# Patient Record
Sex: Female | Born: 1970 | Race: White | Hispanic: No | Marital: Single | Smoking: Never smoker
Health system: Southern US, Community
[De-identification: ages and names within clinical notes are randomized; demographics above are authoritative.]

## PROBLEM LIST (undated history)

## (undated) DIAGNOSIS — R51 Headache: Secondary | ICD-10-CM

## (undated) DIAGNOSIS — F419 Anxiety disorder, unspecified: Secondary | ICD-10-CM

## (undated) DIAGNOSIS — N200 Calculus of kidney: Secondary | ICD-10-CM

## (undated) DIAGNOSIS — Z87442 Personal history of urinary calculi: Secondary | ICD-10-CM

## (undated) DIAGNOSIS — F329 Major depressive disorder, single episode, unspecified: Secondary | ICD-10-CM

## (undated) DIAGNOSIS — N83209 Unspecified ovarian cyst, unspecified side: Secondary | ICD-10-CM

## (undated) DIAGNOSIS — T7840XA Allergy, unspecified, initial encounter: Secondary | ICD-10-CM

## (undated) DIAGNOSIS — R519 Headache, unspecified: Secondary | ICD-10-CM

## (undated) DIAGNOSIS — F32A Depression, unspecified: Secondary | ICD-10-CM

## (undated) HISTORY — PX: KIDNEY STONE SURGERY: SHX686

## (undated) HISTORY — DX: Calculus of kidney: N20.0

## (undated) HISTORY — DX: Major depressive disorder, single episode, unspecified: F32.9

## (undated) HISTORY — PX: FRACTURE SURGERY: SHX138

## (undated) HISTORY — PX: TONSILLECTOMY: SUR1361

## (undated) HISTORY — DX: Depression, unspecified: F32.A

## (undated) HISTORY — DX: Unspecified ovarian cyst, unspecified side: N83.209

## (undated) HISTORY — DX: Anxiety disorder, unspecified: F41.9

## (undated) HISTORY — DX: Headache: R51

## (undated) HISTORY — DX: Headache, unspecified: R51.9

## (undated) HISTORY — DX: Allergy, unspecified, initial encounter: T78.40XA

## (undated) HISTORY — PX: TUBAL LIGATION: SHX77

## (undated) HISTORY — PX: FEMUR FRACTURE SURGERY: SHX633

---
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
## 2001-05-16 ENCOUNTER — Emergency Department (HOSPITAL_COMMUNITY): Admission: EM | Admit: 2001-05-16 | Discharge: 2001-05-16 | Payer: Self-pay | Admitting: Emergency Medicine

## 2001-05-16 ENCOUNTER — Encounter: Payer: Self-pay | Admitting: Emergency Medicine

## 2004-09-29 ENCOUNTER — Inpatient Hospital Stay: Payer: Self-pay

## 2004-11-05 ENCOUNTER — Ambulatory Visit: Payer: Self-pay

## 2006-06-26 ENCOUNTER — Ambulatory Visit: Payer: Self-pay | Admitting: Unknown Physician Specialty

## 2006-06-29 ENCOUNTER — Ambulatory Visit: Payer: Self-pay | Admitting: Unknown Physician Specialty

## 2006-06-30 ENCOUNTER — Inpatient Hospital Stay: Payer: Self-pay | Admitting: Unknown Physician Specialty

## 2006-08-29 ENCOUNTER — Ambulatory Visit: Payer: Self-pay | Admitting: Unknown Physician Specialty

## 2007-02-22 DIAGNOSIS — A0472 Enterocolitis due to Clostridium difficile, not specified as recurrent: Secondary | ICD-10-CM

## 2007-02-22 HISTORY — DX: Enterocolitis due to Clostridium difficile, not specified as recurrent: A04.72

## 2010-04-19 ENCOUNTER — Ambulatory Visit: Payer: Self-pay | Admitting: Obstetrics and Gynecology

## 2012-10-03 ENCOUNTER — Ambulatory Visit: Payer: Self-pay | Admitting: Obstetrics and Gynecology

## 2013-03-21 ENCOUNTER — Ambulatory Visit: Payer: Self-pay | Admitting: Family Medicine

## 2013-04-09 ENCOUNTER — Ambulatory Visit: Payer: Self-pay | Admitting: Family Medicine

## 2013-07-15 ENCOUNTER — Ambulatory Visit: Payer: Self-pay | Admitting: Physician Assistant

## 2013-07-15 LAB — RAPID STREP-A WITH REFLX: Micro Text Report: POSITIVE

## 2013-07-23 DIAGNOSIS — M5126 Other intervertebral disc displacement, lumbar region: Secondary | ICD-10-CM | POA: Insufficient documentation

## 2013-07-23 DIAGNOSIS — M5136 Other intervertebral disc degeneration, lumbar region: Secondary | ICD-10-CM | POA: Insufficient documentation

## 2014-01-09 ENCOUNTER — Ambulatory Visit: Payer: Self-pay | Admitting: Obstetrics and Gynecology

## 2014-07-29 ENCOUNTER — Encounter: Payer: Self-pay | Admitting: Psychiatry

## 2014-07-29 ENCOUNTER — Ambulatory Visit (INDEPENDENT_AMBULATORY_CARE_PROVIDER_SITE_OTHER): Payer: 59 | Admitting: Psychiatry

## 2014-07-29 ENCOUNTER — Other Ambulatory Visit: Payer: Self-pay

## 2014-07-29 VITALS — BP 118/76 | HR 71 | Temp 98.5°F | Ht 68.0 in

## 2014-07-29 DIAGNOSIS — F331 Major depressive disorder, recurrent, moderate: Secondary | ICD-10-CM

## 2014-07-29 MED ORDER — ZOLPIDEM TARTRATE 5 MG PO TABS: 5.0000 mg | ORAL_TABLET | Freq: Every evening | ORAL | Status: DC | PRN

## 2014-07-29 MED ORDER — DULOXETINE HCL 30 MG PO CPEP: 30.0000 mg | ORAL_CAPSULE | Freq: Every day | ORAL | Status: DC

## 2014-07-29 NOTE — Progress Notes (Signed)
BH MD/PA/NP OP Progress Note  07/29/2014 4:36 PM Susan Garner  MRN:  [id number]  Subjective:  As documented in all scripts, patient indicated that she was having problems with Wellbutrin and that it was making her more depressed. In our phone conversation on 10/17/2014 I instructed her to discontinue the Wellbutrin 150 mg XL and that we could restart her Effexor until she was seen in the appointment today. However patient came she continue taking the Wellbutrin 150 mg XL. She states she continues to feel depressed and feels is probably worse on the Wellbutrin. She indicates she never picked up the Effexor to restart it. She states she has not improve with the well which can and also has been having diarrhea since starting the Wellbutrin. She states that the diarrhea does not occur every day but is occurred pretty frequently since starting Wellbutrin.  She states that she has had prior trials of Prozac and Lexapro as well as her Effexor. We discussed that the plan would be to start a medication such as vibrated. However patient brought up that she has not been on Cymbalta. Given that she had a good response for several years with Effexor we will initiate a trial of Cymbalta.   Patient indicated that she spoke with a coworker and in the conversation and they surmised that the patient is a negative view on things in general. The patient indicated her main concerns are a lack of desire to do anything or go anywhere. She also states a recent stressor is that her daughter has been diagnosed with ADHD. She continues to go to work but does state things are a struggle. Chief Complaint: depression Chief Complaint    Depression     Visit Diagnosis:     ICD-9-CM ICD-10-CM   1. Major depressive disorder, recurrent episode, moderate 296.32 F33.1 zolpidem (AMBIEN) 5 MG tablet     DULoxetine (CYMBALTA) 30 MG capsule    Past Medical History:  Past Medical History  Diagnosis Date  . Anxiety   .  Depression     Past Surgical History  Procedure Laterality Date  . Tubal ligation    . Tonsillectomy     Family History: History reviewed. No pertinent family history. Social History:  History   Social History  . Marital Status: Single    Spouse Name: N/A  . Number of Children: N/A  . Years of Education: N/A   Social History Main Topics  . Smoking status: Never Smoker   . Smokeless tobacco: Never Used  . Alcohol Use: No  . Drug Use: No  . Sexual Activity: No   Other Topics Concern  . None   Social History Narrative  . None   Additional History:   Assessment:   Musculoskeletal: Strength & Muscle Tone: within normal limits Gait & Station: normal Patient leans: N/A  Psychiatric Specialty Exam: HPI  Review of Systems  Psychiatric/Behavioral: Positive for depression. Negative for suicidal ideas, hallucinations, memory loss and substance abuse. The patient is nervous/anxious and has insomnia.     Blood pressure 118/76, pulse 71, temperature 98.5 F (36.9 C), temperature source Tympanic, height  (1.727 m), last menstrual period 07/16/2014, SpO2 93 %.There is no weight on file to calculate BMI.  General Appearance: Neat and Well Groomed  Eye Contact:  Good  Speech:  Clear and Coherent and Normal Rate  Volume:  Normal  Mood:  Depressed  Affect:  Constricted  Thought Process:  Linear and Logical  Orientation:  Negative  Thought Content:  Negative  Suicidal Thoughts:  No  Homicidal Thoughts:  No  Memory:  Immediate;   Good Recent;   Good Remote;   Good  Judgement:  Good  Insight:  Good  Psychomotor Activity:  Negative  Concentration:  Good  Recall:  Good  Fund of Knowledge: Good  Language: Good  Akathisia:  Negative  Handed:  Right unknown  AIMS (if indicated):  Not done  Assets:  Communication Skills Desire for Improvement  ADL's:  Intact  Cognition: WNL  Sleep:  Poor to discusses that anxiety keeps her up    Is the patient at risk to self?   No. Has the patient been a risk to self in the past 6 months?  No. Has the patient been a risk to self within the distant past?  No. Is the patient a risk to others?  No. Has the patient been a risk to others in the past 6 months?  No. Has the patient been a risk to others within the distant past?  No.  Current Medications: Current Outpatient Prescriptions  Medication Sig Dispense Refill  . buPROPion (WELLBUTRIN XL) 150 MG 24 hr tablet     . DULoxetine (CYMBALTA) 30 MG capsule Take 1 capsule (30 mg total) by mouth daily. 30 capsule 1  . nitrofurantoin, macrocrystal-monohydrate, (MACROBID) 100 MG capsule     . venlafaxine XR (EFFEXOR-XR) 75 MG 24 hr capsule     . zolpidem (AMBIEN) 5 MG tablet Take 1 tablet (5 mg total) by mouth at bedtime as needed for sleep. 30 tablet 1   No current facility-administered medications for this visit.    Medical Decision Making:  Established Problem, Stable/Improving (1) and Established Problem, Worsening (2) patient reports her depression has worsened.  Treatment Plan Summary:Medication management Patient has been instructed to discontinue the Wellbutrin XL. She will start Cymbalta 30 mg a day. We'll also start Ambien 5 mg at bedtime as needed for insomnia. Patient will follow-up in 4-5 weeks. She's been encouraged call with any questions or concerns prior to her next appointment.  Wallace Goinglton Danyael Alipio 07/29/2014, 4:36 PM

## 2014-07-30 ENCOUNTER — Ambulatory Visit: Payer: Self-pay | Admitting: Psychiatry

## 2014-09-03 ENCOUNTER — Ambulatory Visit: Payer: 59 | Admitting: Psychiatry

## 2014-10-10 ENCOUNTER — Ambulatory Visit: Payer: 59 | Admitting: Psychiatry

## 2014-11-04 ENCOUNTER — Telehealth: Payer: Self-pay | Admitting: Family Medicine

## 2014-11-04 NOTE — Telephone Encounter (Signed)
Patient saw you at [facility] and states that you had referred her for back pain. She had seen someone at [facility] but decided not to see them. Requesting that you send a referral to [facility]. (P) [phone] (F) [phone]--i did inform patient she may have to schedule an appointment, she understood, but requested that i send the message first to see what the doctor said.

## 2014-11-04 NOTE — Telephone Encounter (Signed)
Routed to Dr. Shah for referral 

## 2014-11-11 ENCOUNTER — Ambulatory Visit (INDEPENDENT_AMBULATORY_CARE_PROVIDER_SITE_OTHER): Payer: Managed Care, Other (non HMO) | Admitting: Family Medicine

## 2014-11-11 ENCOUNTER — Encounter: Payer: Self-pay | Admitting: Family Medicine

## 2014-11-11 VITALS — BP 110/76 | HR 105 | Temp 98.0°F | Resp 16 | Ht 68.0 in | Wt 156.4 lb

## 2014-11-11 DIAGNOSIS — J320 Chronic maxillary sinusitis: Secondary | ICD-10-CM | POA: Insufficient documentation

## 2014-11-11 DIAGNOSIS — J0110 Acute frontal sinusitis, unspecified: Secondary | ICD-10-CM | POA: Diagnosis not present

## 2014-11-11 DIAGNOSIS — G8929 Other chronic pain: Secondary | ICD-10-CM | POA: Diagnosis not present

## 2014-11-11 DIAGNOSIS — M5416 Radiculopathy, lumbar region: Principal | ICD-10-CM

## 2014-11-11 DIAGNOSIS — M5410 Radiculopathy, site unspecified: Secondary | ICD-10-CM | POA: Diagnosis not present

## 2014-11-11 MED ORDER — AZITHROMYCIN 250 MG PO TABS: 250.0000 mg | ORAL_TABLET | Freq: Every day | ORAL | Status: DC

## 2014-11-11 NOTE — Progress Notes (Signed)
Name: Susan Garner   MRN: [id number]    DOB: 09/12/1970   Date:11/11/2014       Progress Note  Subjective  Chief Complaint  Chief Complaint  Patient presents with  . Back Pain    Back Pain This is a chronic problem. The pain is present in the lumbar spine. The pain radiates to the left knee and right knee. The pain is at a severity of 5/10. The pain is moderate. Pertinent negatives include no bladder incontinence or bowel incontinence. She has tried analgesics for the symptoms.  Sinusitis This is a new problem. There has been no fever. Associated symptoms include sinus pressure. Pertinent negatives include no chills, congestion, coughing, ear pain, sore throat or swollen glands. Past treatments include nothing.    Past Medical History  Diagnosis Date  . Anxiety   . Depression     Past Surgical History  Procedure Laterality Date  . Tubal ligation    . Tonsillectomy      No family history on file.  Social History   Social History  . Marital Status: Single    Spouse Name: N/A  . Number of Children: N/A  . Years of Education: N/A   Occupational History  . Not on file.   Social History Main Topics  . Smoking status: Never Smoker   . Smokeless tobacco: Never Used  . Alcohol Use: No  . Drug Use: No  . Sexual Activity: No   Other Topics Concern  . Not on file   Social History Narrative    No current outpatient prescriptions on file.  No Known Allergies   Review of Systems  Constitutional: Negative for chills.  HENT: Positive for sinus pressure. Negative for congestion, ear pain and sore throat.   Respiratory: Negative for cough.   Gastrointestinal: Negative for bowel incontinence.  Genitourinary: Negative for bladder incontinence.  Musculoskeletal: Positive for back pain.   Objective  Filed Vitals:   11/11/14 1158  BP: 110/76  Pulse: 105  Temp: 98 F (36.7 C)  Resp: 16  Height:  (1.727 m)  Weight: 156 lb 7 oz (70.96 kg)  SpO2: 96%     Physical Exam  Constitutional: She is well-developed, well-nourished, and in no distress.  HENT:  Right Ear: Tympanic membrane normal.  Left Ear: Tympanic membrane normal.  Nose: Right sinus exhibits maxillary sinus tenderness and frontal sinus tenderness. Left sinus exhibits maxillary sinus tenderness and frontal sinus tenderness.  Mouth/Throat: Posterior oropharyngeal erythema present.  Cardiovascular: Normal rate and regular rhythm.   Pulmonary/Chest: Effort normal and breath sounds normal.  Musculoskeletal:       Lumbar back: She exhibits tenderness and pain. She exhibits no spasm.       Back:  Nursing note and vitals reviewed.   Assessment & Plan  1. Chronic radicular low back pain Recurrence of chronic low back pain. No red flags at present but patient has history of disc herniation. Patient wishes to be referred to [facility]. - Ambulatory referral to Neurosurgery  2. Acute frontal sinusitis, recurrence not specified  - azithromycin (ZITHROMAX Z-PAK) 250 MG tablet; Take 1 tablet (250 mg total) by mouth daily. 2 tabs po x day 1, then 1 tab po q day x 4 days.  Dispense: 6 each; Refill: 0   Syed Asad [facility] 11/11/2014 12:44 PM

## 2014-11-16 NOTE — Telephone Encounter (Signed)
Patient was seen for office visit on 11/11/2014 and referral to neurosurgery provided

## 2014-11-19 NOTE — Telephone Encounter (Signed)
Per Dr. Sherryll Burger Patient was seen for office visit on 11/11/2014 and referral to neurosurgery provided

## 2014-11-25 ENCOUNTER — Encounter: Payer: Self-pay | Admitting: Family Medicine

## 2014-11-25 ENCOUNTER — Ambulatory Visit (INDEPENDENT_AMBULATORY_CARE_PROVIDER_SITE_OTHER): Payer: Managed Care, Other (non HMO) | Admitting: Family Medicine

## 2014-11-25 VITALS — BP 112/70 | HR 92 | Temp 98.8°F | Resp 18 | Ht 68.0 in | Wt 157.8 lb

## 2014-11-25 DIAGNOSIS — F32A Depression, unspecified: Secondary | ICD-10-CM | POA: Insufficient documentation

## 2014-11-25 DIAGNOSIS — H9201 Otalgia, right ear: Secondary | ICD-10-CM | POA: Insufficient documentation

## 2014-11-25 DIAGNOSIS — R5382 Chronic fatigue, unspecified: Secondary | ICD-10-CM

## 2014-11-25 DIAGNOSIS — F329 Major depressive disorder, single episode, unspecified: Secondary | ICD-10-CM | POA: Diagnosis not present

## 2014-11-25 DIAGNOSIS — F419 Anxiety disorder, unspecified: Secondary | ICD-10-CM | POA: Diagnosis not present

## 2014-11-25 DIAGNOSIS — R5383 Other fatigue: Secondary | ICD-10-CM | POA: Insufficient documentation

## 2014-11-25 MED ORDER — ALPRAZOLAM 0.25 MG PO TABS: 0.2500 mg | ORAL_TABLET | Freq: Two times a day (BID) | ORAL | Status: DC | PRN

## 2014-11-25 MED ORDER — CITALOPRAM HYDROBROMIDE 20 MG PO TABS: 20.0000 mg | ORAL_TABLET | Freq: Every day | ORAL | Status: DC

## 2014-11-25 MED ORDER — AMOXICILLIN-POT CLAVULANATE 875-125 MG PO TABS: 1.0000 | ORAL_TABLET | Freq: Two times a day (BID) | ORAL | Status: DC

## 2014-11-25 NOTE — Progress Notes (Signed)
Name: Susan Garner   MRN: [id number]    DOB: 01/17/1971   Date:11/25/2014       Progress Note  Subjective  Chief Complaint  Chief Complaint  Patient presents with  . Follow-up    2 wk  . Anxiety   Anxiety Presents for follow-up visit. Symptoms include decreased concentration, depressed mood, excessive worry, insomnia, irritability, malaise, nervous/anxious behavior and restlessness. Patient reports no panic, shortness of breath or suicidal ideas.   Her past medical history is significant for depression.  Depression      The patient presents with depression.  This is a chronic problem.  The problem occurs constantly.  Associated symptoms include decreased concentration, fatigue, insomnia, irritable, restlessness and headaches.  Associated symptoms include no suicidal ideas.  Past treatments include SSRIs - Selective serotonin reuptake inhibitors and SNRIs - Serotonin and norepinephrine reuptake inhibitors (She has tried Prozac, Lexapro, Cymbalta, and Wellbutrin in the past).  Past medical history includes anxiety and depression.   Ear Fullness  There is pain in the right ear. This is a recurrent problem. The current episode started more than 1 month ago. There has been no fever. Associated symptoms include headaches. Pertinent negatives include no ear discharge or hearing loss.   Past Medical History  Diagnosis Date  . Anxiety   . Depression     Past Surgical History  Procedure Laterality Date  . Tubal ligation    . Tonsillectomy      History reviewed. No pertinent family history.  Social History   Social History  . Marital Status: Single    Spouse Name: N/A  . Number of Children: N/A  . Years of Education: N/A   Occupational History  . Not on file.   Social History Main Topics  . Smoking status: Never Smoker   . Smokeless tobacco: Never Used  . Alcohol Use: No  . Drug Use: No  . Sexual Activity: No   Other Topics Concern  . Not on file   Social History  Narrative    No current outpatient prescriptions on file.  No Known Allergies  Review of Systems  Constitutional: Positive for irritability and fatigue. Negative for fever and chills.  HENT: Positive for ear pain. Negative for ear discharge, hearing loss and tinnitus.   Respiratory: Negative for shortness of breath.   Neurological: Positive for headaches.  Psychiatric/Behavioral: Positive for depression and decreased concentration. Negative for suicidal ideas and substance abuse. The patient is nervous/anxious and has insomnia.    Objective  Filed Vitals:   11/25/14 1409  BP: 112/70  Pulse: 92  Temp: 98.8 F (37.1 C)  TempSrc: Oral  Resp: 18  Height:  (1.727 m)  Weight: 157 lb 12.8 oz (71.578 kg)  SpO2: 98%   Physical Exam  Constitutional: She is oriented to person, place, and time and well-developed, well-nourished, and in no distress. She is irritable.  HENT:  Right Ear: Hearing, tympanic membrane and ear canal normal.  Nose: Right sinus exhibits frontal sinus tenderness. Right sinus exhibits no maxillary sinus tenderness. Left sinus exhibits no frontal sinus tenderness.  Mouth/Throat: No posterior oropharyngeal erythema.  Cardiovascular: Normal rate and regular rhythm.   Pulmonary/Chest: Effort normal and breath sounds normal.  Abdominal: Soft. Bowel sounds are normal.  Neurological: She is alert and oriented to person, place, and time.  Psychiatric: Memory, affect and judgment normal.  Nursing note and vitals reviewed.   Assessment & Plan  1. Anxiety Started patient on Alprazolam 0.25 mg twice a  day as needed for recurrent symptoms of anxiety. Follow-up in one month.  - ALPRAZolam (XANAX) 0.25 MG tablet; Take 1 tablet (0.25 mg total) by mouth 2 (two) times daily as needed for anxiety.  Dispense: 60 tablet; Refill: 0  2. Depression Started patient on citalopram 20 mg daily for symptoms of depression unresponsive to multiple medications in the past. Educated  on potential adverse reactions. Follow-up in one month. - citalopram (CELEXA) 20 MG tablet; Take 1 tablet (20 mg total) by mouth daily.  Dispense: 30 tablet; Refill: 0  3. Chronic fatigue  - CBC with Differential - Comprehensive Metabolic Panel (CMET) - Vitamin D (25 hydroxy) - TSH - HgB A1c  4. Right ear pain Symptoms consistent with  Sinusitis. We will prescribe Augmentin for 10 days and encouraged patient to follow up with ENT. She verbalized understanding. - amoxicillin-clavulanate (AUGMENTIN) 875-125 MG tablet; Take 1 tablet by mouth 2 (two) times daily.  Dispense: 20 tablet; Refill: 0  Jazara Swiney [facility]ad [facility] 11/25/2014 2:21 PM

## 2014-11-26 LAB — COMPREHENSIVE METABOLIC PANEL
ALBUMIN: 4.6 g/dL (ref 3.5–5.5)
ALK PHOS: 59 IU/L (ref 39–117)
ALT: 7 IU/L (ref 0–32)
AST: 12 IU/L (ref 0–40)
Albumin/Globulin Ratio: 1.8 (ref 1.1–2.5)
BUN / CREAT RATIO: 15 (ref 9–23)
BUN: 11 mg/dL (ref 6–24)
Bilirubin Total: 0.5 mg/dL (ref 0.0–1.2)
CO2: 24 mmol/L (ref 18–29)
CREATININE: 0.74 mg/dL (ref 0.57–1.00)
Calcium: 9.7 mg/dL (ref 8.7–10.2)
Chloride: 101 mmol/L (ref 97–108)
GFR calc non Af Amer: 100 mL/min/{1.73_m2} (ref >59)
GFR, EST AFRICAN AMERICAN: 115 mL/min/{1.73_m2} (ref >59)
GLOBULIN, TOTAL: 2.6 g/dL (ref 1.5–4.5)
Glucose: 87 mg/dL (ref 65–99)
Potassium: 4.6 mmol/L (ref 3.5–5.2)
SODIUM: 139 mmol/L (ref 134–144)
TOTAL PROTEIN: 7.2 g/dL (ref 6.0–8.5)

## 2014-11-26 LAB — CBC WITH DIFFERENTIAL/PLATELET
Basophils Absolute: 0 10*3/uL (ref 0.0–0.2)
Basos: 1 %
EOS (ABSOLUTE): 0.1 10*3/uL (ref 0.0–0.4)
EOS: 2 %
HEMATOCRIT: 37 % (ref 34.0–46.6)
HEMOGLOBIN: 12.2 g/dL (ref 11.1–15.9)
Immature Grans (Abs): 0 10*3/uL (ref 0.0–0.1)
Immature Granulocytes: 0 %
LYMPHS ABS: 2.1 10*3/uL (ref 0.7–3.1)
Lymphs: 26 %
MCH: 29 pg (ref 26.6–33.0)
MCHC: 33 g/dL (ref 31.5–35.7)
MCV: 88 fL (ref 79–97)
MONOCYTES: 7 %
Monocytes Absolute: 0.6 10*3/uL (ref 0.1–0.9)
NEUTROS ABS: 5.2 10*3/uL (ref 1.4–7.0)
Neutrophils: 64 %
Platelets: 326 10*3/uL (ref 150–379)
RBC: 4.2 x10E6/uL (ref 3.77–5.28)
RDW: 12.6 % (ref 12.3–15.4)
WBC: 8.1 10*3/uL (ref 3.4–10.8)

## 2014-11-26 LAB — HEMOGLOBIN A1C
Est. average glucose Bld gHb Est-mCnc: 105 mg/dL
Hgb A1c MFr Bld: 5.3 % (ref 4.8–5.6)

## 2014-11-26 LAB — VITAMIN D 25 HYDROXY (VIT D DEFICIENCY, FRACTURES): VIT D 25 HYDROXY: 29.1 ng/mL — AB (ref 30.0–100.0)

## 2014-11-26 LAB — TSH: TSH: 1.37 u[IU]/mL (ref 0.450–4.500)

## 2014-12-10 ENCOUNTER — Other Ambulatory Visit: Payer: Self-pay | Admitting: Neurosurgery

## 2014-12-10 DIAGNOSIS — M5126 Other intervertebral disc displacement, lumbar region: Secondary | ICD-10-CM

## 2014-12-23 ENCOUNTER — Telehealth: Payer: Self-pay | Admitting: Family Medicine

## 2014-12-23 DIAGNOSIS — F329 Major depressive disorder, single episode, unspecified: Secondary | ICD-10-CM

## 2014-12-23 DIAGNOSIS — F32A Depression, unspecified: Secondary | ICD-10-CM

## 2014-12-23 NOTE — Telephone Encounter (Signed)
Patient is requesting a refill of citalopram.

## 2014-12-24 ENCOUNTER — Telehealth: Payer: Self-pay | Admitting: Emergency Medicine

## 2014-12-24 MED ORDER — CITALOPRAM HYDROBROMIDE 20 MG PO TABS: 20.0000 mg | ORAL_TABLET | Freq: Every day | ORAL | Status: DC

## 2014-12-24 NOTE — Telephone Encounter (Signed)
Script sent to pharmacy.

## 2014-12-26 ENCOUNTER — Ambulatory Visit
Admission: RE | Admit: 2014-12-26 | Discharge: 2014-12-26 | Disposition: A | Discharge status: Home / Self Care | Payer: Managed Care, Other (non HMO) | Source: Ambulatory Visit | Attending: Neurosurgery | Admitting: Neurosurgery

## 2014-12-26 DIAGNOSIS — M5126 Other intervertebral disc displacement, lumbar region: Secondary | ICD-10-CM | POA: Diagnosis present

## 2014-12-26 DIAGNOSIS — M5127 Other intervertebral disc displacement, lumbosacral region: Secondary | ICD-10-CM | POA: Insufficient documentation

## 2015-01-06 ENCOUNTER — Other Ambulatory Visit: Payer: Self-pay | Admitting: Obstetrics and Gynecology

## 2015-01-06 DIAGNOSIS — Z1231 Encounter for screening mammogram for malignant neoplasm of breast: Secondary | ICD-10-CM

## 2015-01-23 ENCOUNTER — Other Ambulatory Visit: Payer: Self-pay | Admitting: Family Medicine

## 2015-02-19 ENCOUNTER — Ambulatory Visit: Payer: Managed Care, Other (non HMO) | Attending: Anesthesiology | Admitting: Anesthesiology

## 2015-02-19 ENCOUNTER — Encounter: Payer: Self-pay | Admitting: Anesthesiology

## 2015-02-19 VITALS — BP 115/64 | HR 62 | Temp 98.1°F | Resp 18 | Ht 68.0 in | Wt 158.0 lb

## 2015-02-19 DIAGNOSIS — M2170 Unequal limb length (acquired), unspecified site: Secondary | ICD-10-CM | POA: Diagnosis not present

## 2015-02-19 DIAGNOSIS — M5136 Other intervertebral disc degeneration, lumbar region: Secondary | ICD-10-CM | POA: Diagnosis not present

## 2015-02-19 DIAGNOSIS — M5116 Intervertebral disc disorders with radiculopathy, lumbar region: Secondary | ICD-10-CM | POA: Diagnosis not present

## 2015-02-19 DIAGNOSIS — F329 Major depressive disorder, single episode, unspecified: Secondary | ICD-10-CM | POA: Diagnosis not present

## 2015-02-19 DIAGNOSIS — M461 Sacroiliitis, not elsewhere classified: Secondary | ICD-10-CM | POA: Insufficient documentation

## 2015-02-19 DIAGNOSIS — M5441 Lumbago with sciatica, right side: Secondary | ICD-10-CM

## 2015-02-19 DIAGNOSIS — K6289 Other specified diseases of anus and rectum: Secondary | ICD-10-CM | POA: Diagnosis present

## 2015-02-19 DIAGNOSIS — M47817 Spondylosis without myelopathy or radiculopathy, lumbosacral region: Secondary | ICD-10-CM | POA: Diagnosis not present

## 2015-02-19 DIAGNOSIS — M5431 Sciatica, right side: Secondary | ICD-10-CM | POA: Diagnosis not present

## 2015-02-19 DIAGNOSIS — M533 Sacrococcygeal disorders, not elsewhere classified: Secondary | ICD-10-CM

## 2015-02-19 DIAGNOSIS — M549 Dorsalgia, unspecified: Secondary | ICD-10-CM | POA: Diagnosis present

## 2015-02-19 DIAGNOSIS — M545 Low back pain: Secondary | ICD-10-CM | POA: Insufficient documentation

## 2015-02-19 DIAGNOSIS — F419 Anxiety disorder, unspecified: Secondary | ICD-10-CM | POA: Diagnosis not present

## 2015-02-19 NOTE — Patient Instructions (Signed)
GENERAL RISKS AND COMPLICATIONS  What are the risk, side effects and possible complications? Generally speaking, most procedures are safe.  However, with any procedure there are risks, side effects, and the possibility of complications.  The risks and complications are dependent upon the sites that are lesioned, or the type of nerve block to be performed.  The closer the procedure is to the spine, the more serious the risks are.  Great care is taken when placing the radio frequency needles, block needles or lesioning probes, but sometimes complications can occur. 1. Infection: Any time there is an injection through the skin, there is a risk of infection.  This is why sterile conditions are used for these blocks.  There are four possible types of infection. 1. Localized skin infection. 2. Central Nervous System Infection-This can be in the form of Meningitis, which can be deadly. 3. Epidural Infections-This can be in the form of an epidural abscess, which can cause pressure inside of the spine, causing compression of the spinal cord with subsequent paralysis. This would require an emergency surgery to decompress, and there are no guarantees that the patient would recover from the paralysis. 4. Discitis-This is an infection of the intervertebral discs.  It occurs in about 1% of discography procedures.  It is difficult to treat and it may lead to surgery.        2. Pain: the needles have to go through skin and soft tissues, will cause soreness.       3. Damage to internal structures:  The nerves to be lesioned may be near blood vessels or    other nerves which can be potentially damaged.       4. Bleeding: Bleeding is more common if the patient is taking blood thinners such as  aspirin, Coumadin, Ticiid, Plavix, etc., or if he/she have some genetic predisposition  such as hemophilia. Bleeding into the spinal canal can cause compression of the spinal  cord with subsequent paralysis.  This would require an  emergency surgery to  decompress and there are no guarantees that the patient would recover from the  paralysis.       5. Pneumothorax:  Puncturing of a lung is a possibility, every time a needle is introduced in  the area of the chest or upper back.  Pneumothorax refers to free air around the  collapsed lung(s), inside of the thoracic cavity (chest cavity).  Another two possible  complications related to a similar event would include: Hemothorax and Chylothorax.   These are variations of the Pneumothorax, where instead of air around the collapsed  lung(s), you may have blood or chyle, respectively.       6. Spinal headaches: They may occur with any procedures in the area of the spine.       7. Persistent CSF (Cerebro-Spinal Fluid) leakage: This is a rare problem, but may occur  with prolonged intrathecal or epidural catheters either due to the formation of a fistulous  track or a dural tear.       8. Nerve damage: By working so close to the spinal cord, there is always a possibility of  nerve damage, which could be as serious as a permanent spinal cord injury with  paralysis.       9. Death:  Although rare, severe deadly allergic reactions known as "Anaphylactic  reaction" can occur to any of the medications used.      10. Worsening of the symptoms:  We can always make thing worse.    What are the chances of something like this happening? Chances of any of this occuring are extremely low.  By statistics, you have more of a chance of getting killed in a motor vehicle accident: while driving to the hospital than any of the above occurring .  Nevertheless, you should be aware that they are possibilities.  In general, it is similar to taking a shower.  Everybody knows that you can slip, hit your head and get killed.  Does that mean that you should not shower again?  Nevertheless always keep in mind that statistics do not mean anything if you happen to be on the wrong side of them.  Even if a procedure has a 1  (one) in a 1,000,000 (million) chance of going wrong, it you happen to be that one..Also, keep in mind that by statistics, you have more of a chance of having something go wrong when taking medications.  Who should not have this procedure? If you are on a blood thinning medication (e.g. Coumadin, Plavix, see list of "Blood Thinners"), or if you have an active infection going on, you should not have the procedure.  If you are taking any blood thinners, please inform your physician.  How should I prepare for this procedure?  Do not eat or drink anything at least six hours prior to the procedure.  Bring a driver with you .  It cannot be a taxi.  Come accompanied by an adult that can drive you back, and that is strong enough to help you if your legs get weak or numb from the local anesthetic.  Take all of your medicines the morning of the procedure with just enough water to swallow them.  If you have diabetes, make sure that you are scheduled to have your procedure done first thing in the morning, whenever possible.  If you have diabetes, take only half of your insulin dose and notify our nurse that you have done so as soon as you arrive at the clinic.  If you are diabetic, but only take blood sugar pills (oral hypoglycemic), then do not take them on the morning of your procedure.  You may take them after you have had the procedure.  Do not take aspirin or any aspirin-containing medications, at least eleven (11) days prior to the procedure.  They may prolong bleeding.  Wear loose fitting clothing that may be easy to take off and that you would not mind if it got stained with Betadine or blood.  Do not wear any jewelry or perfume  Remove any nail coloring.  It will interfere with some of our monitoring equipment.  NOTE: Remember that this is not meant to be interpreted as a complete list of all possible complications.  Unforeseen problems may occur.  BLOOD THINNERS The following drugs  contain aspirin or other products, which can cause increased bleeding during surgery and should not be taken for 2 weeks prior to and 1 week after surgery.  If you should need take something for relief of minor pain, you may take acetaminophen which is found in Tylenol,m Datril, Anacin-3 and Panadol. It is not blood thinner. The products listed below are.  Do not take any of the products listed below in addition to any listed on your instruction sheet.  A.P.C or A.P.C with Codeine Codeine Phosphate Capsules #3 Ibuprofen Ridaura  ABC compound Congesprin Imuran rimadil  Advil Cope Indocin Robaxisal  Alka-Seltzer Effervescent Pain Reliever and Antacid Coricidin or Coricidin-D  Indomethacin Rufen    Alka-Seltzer plus Cold Medicine Cosprin Ketoprofen S-A-C Tablets  Anacin Analgesic Tablets or Capsules Coumadin Korlgesic Salflex  Anacin Extra Strength Analgesic tablets or capsules CP-2 Tablets Lanoril Salicylate  Anaprox Cuprimine Capsules Levenox Salocol  Anexsia-D Dalteparin Magan Salsalate  Anodynos Darvon compound Magnesium Salicylate Sine-off  Ansaid Dasin Capsules Magsal Sodium Salicylate  Anturane Depen Capsules Marnal Soma  APF Arthritis pain formula Dewitt's Pills Measurin Stanback  Argesic Dia-Gesic Meclofenamic Sulfinpyrazone  Arthritis Bayer Timed Release Aspirin Diclofenac Meclomen Sulindac  Arthritis pain formula Anacin Dicumarol Medipren Supac  Analgesic (Safety coated) Arthralgen Diffunasal Mefanamic Suprofen  Arthritis Strength Bufferin Dihydrocodeine Mepro Compound Suprol  Arthropan liquid Dopirydamole Methcarbomol with Aspirin Synalgos  ASA tablets/Enseals Disalcid Micrainin Tagament  Ascriptin Doan's Midol Talwin  Ascriptin A/D Dolene Mobidin Tanderil  Ascriptin Extra Strength Dolobid Moblgesic Ticlid  Ascriptin with Codeine Doloprin or Doloprin with Codeine Momentum Tolectin  Asperbuf Duoprin Mono-gesic Trendar  Aspergum Duradyne Motrin or Motrin IB Triminicin  Aspirin  plain, buffered or enteric coated Durasal Myochrisine Trigesic  Aspirin Suppositories Easprin Nalfon Trillsate  Aspirin with Codeine Ecotrin Regular or Extra Strength Naprosyn Uracel  Atromid-S Efficin Naproxen Ursinus  Auranofin Capsules Elmiron Neocylate Vanquish  Axotal Emagrin Norgesic Verin  Azathioprine Empirin or Empirin with Codeine Normiflo Vitamin E  Azolid Emprazil Nuprin Voltaren  Bayer Aspirin plain, buffered or children's or timed BC Tablets or powders Encaprin Orgaran Warfarin Sodium  Buff-a-Comp Enoxaparin Orudis Zorpin  Buff-a-Comp with Codeine Equegesic Os-Cal-Gesic   Buffaprin Excedrin plain, buffered or Extra Strength Oxalid   Bufferin Arthritis Strength Feldene Oxphenbutazone   Bufferin plain or Extra Strength Feldene Capsules Oxycodone with Aspirin   Bufferin with Codeine Fenoprofen Fenoprofen Pabalate or Pabalate-SF   Buffets II Flogesic Panagesic   Buffinol plain or Extra Strength Florinal or Florinal with Codeine Panwarfarin   Buf-Tabs Flurbiprofen Penicillamine   Butalbital Compound Four-way cold tablets Penicillin   Butazolidin Fragmin Pepto-Bismol   Carbenicillin Geminisyn Percodan   Carna Arthritis Reliever Geopen Persantine   Carprofen Gold's salt Persistin   Chloramphenicol Goody's Phenylbutazone   Chloromycetin Haltrain Piroxlcam   Clmetidine heparin Plaquenil   Cllnoril Hyco-pap Ponstel   Clofibrate Hydroxy chloroquine Propoxyphen         Before stopping any of these medications, be sure to consult the physician who ordered them.  Some, such as Coumadin (Warfarin) are ordered to prevent or treat serious conditions such as "deep thrombosis", "pumonary embolisms", and other heart problems.  The amount of time that you may need off of the medication may also vary with the medication and the reason for which you were taking it.  If you are taking any of these medications, please make sure you notify your pain physician before you undergo any  procedures.         Epidural Steroid Injection Patient Information  Description: The epidural space surrounds the nerves as they exit the spinal cord.  In some patients, the nerves can be compressed and inflamed by a bulging disc or a tight spinal canal (spinal stenosis).  By injecting steroids into the epidural space, we can bring irritated nerves into direct contact with a potentially helpful medication.  These steroids act directly on the irritated nerves and can reduce swelling and inflammation which often leads to decreased pain.  Epidural steroids may be injected anywhere along the spine and from the neck to the low back depending upon the location of your pain.   After numbing the skin with local anesthetic (like Novocaine), a small needle is passed   into the epidural space slowly.  You may experience a sensation of pressure while this is being done.  The entire block usually last less than 10 minutes.  Conditions which may be treated by epidural steroids:   Low back and leg pain  Neck and arm pain  Spinal stenosis  Post-laminectomy syndrome  Herpes zoster (shingles) pain  Pain from compression fractures  Preparation for the injection:  1. Do not eat any solid food or dairy products within 6 hours of your appointment.  2. You may drink clear liquids up to 2 hours before appointment.  Clear liquids include water, black coffee, juice or soda.  No milk or cream please. 3. You may take your regular medication, including pain medications, with a sip of water before your appointment  Diabetics should hold regular insulin (if taken separately) and take 1/2 normal NPH dos the morning of the procedure.  Carry some sugar containing items with you to your appointment. 4. A driver must accompany you and be prepared to drive you home after your procedure.  5. Bring all your current medications with your. 6. An IV may be inserted and sedation may be given at the discretion of the  physician.   7. A blood pressure cuff, EKG and other monitors will often be applied during the procedure.  Some patients may need to have extra oxygen administered for a short period. 8. You will be asked to provide medical information, including your allergies, prior to the procedure.  We must know immediately if you are taking blood thinners (like Coumadin/Warfarin)  Or if you are allergic to IV iodine contrast (dye). We must know if you could possible be pregnant.  Possible side-effects:  Bleeding from needle site  Infection (rare, may require surgery)  Nerve injury (rare)  Numbness & tingling (temporary)  Difficulty urinating (rare, temporary)  Spinal headache ( a headache worse with upright posture)  Light -headedness (temporary)  Pain at injection site (several days)  Decreased blood pressure (temporary)  Weakness in arm/leg (temporary)  Pressure sensation in back/neck (temporary)  Call if you experience:  Fever/chills associated with headache or increased back/neck pain.  Headache worsened by an upright position.  New onset weakness or numbness of an extremity below the injection site  Hives or difficulty breathing (go to the emergency room)  Inflammation or drainage at the infection site  Severe back/neck pain  Any new symptoms which are concerning to you  Please note:  Although the local anesthetic injected can often make your back or neck feel good for several hours after the injection, the pain will likely return.  It takes 3-7 days for steroids to work in the epidural space.  You may not notice any pain relief for at least that one week.  If effective, we will often do a series of three injections spaced 3-6 weeks apart to maximally decrease your pain.  After the initial series, we generally will wait several months before considering a repeat injection of the same type.  If you have any questions, please call [phone] [facility] 

## 2015-02-19 NOTE — Progress Notes (Signed)
Safety precautions to be maintained throughout the outpatient stay will include: orient to surroundings, keep bed in low position, maintain call bell within reach at all times, provide assistance with transfer out of bed and ambulation.  

## 2015-02-20 NOTE — Progress Notes (Signed)
Subjective:  Patient ID: Susan Garner, female    DOB: 12/31/1970  Age: 44 y.o. MRN: [id number]  CC: Back Pain; Rectal Pain; and Leg Pain   HPI Caly Pellum presents for new patient evaluation.she was referred by Dr. Delma Officer for evaluation. She has a long-standing history of low back pain it's been present over 12 years. Primarily located in her right lower back with radiation in the right posterior and lateral and medial leg. She gets some radiation into the right calf as well. This appears to be quite troublesome especially in the morning when she lets her leg trying to put on her pants. Pain is gradually gotten worse over the last several months to a point where she sought attention with Dr. Lovell Sheehan and was referred here for evaluation and management. Her VAS score is a 10 generally and 9 or 10. Seems to be worse in the morning and afternoon. Aggravating factors include bending and kneeling and lifting squatting and stooping as well as walking and she has trouble getting relief from much of anything. The pain is described as depressing and wakes her up at night but she denies problems with bowel or bladder function and occasionally has some give way weakness but no other weakness noted. She has had a previous MRI scan and this showed evidence of degenerative disc disease most notably at L4-5 and L5-S1.  History Kathlyne has a past medical history of Anxiety and Depression.   She has past surgical history that includes Tubal ligation; Tonsillectomy; and Femur fracture surgery (Right, 1980's).   Her family history is not on file.She reports that she has never smoked. She has never used smokeless tobacco. She reports that she does not drink alcohol or use illicit drugs.   ---------------------------------------------------------------------------------------------------------------------- Past Medical History  Diagnosis Date  . Anxiety   . Depression     Past Surgical  History  Procedure Laterality Date  . Tubal ligation    . Tonsillectomy    . Femur fracture surgery Right 1980's    No family history on file.  Social History  Substance Use Topics  . Smoking status: Never Smoker   . Smokeless tobacco: Never Used  . Alcohol Use: No    ---------------------------------------------------------------------------------------------------------------------- Social History   Social History  . Marital Status: Single    Spouse Name: N/A  . Number of Children: N/A  . Years of Education: N/A   Social History Main Topics  . Smoking status: Never Smoker   . Smokeless tobacco: Never Used  . Alcohol Use: No  . Drug Use: No  . Sexual Activity: No   Other Topics Concern  . None   Social History Narrative    ----------------------------------------------------------------------------------------------------------------------  ROS Review of Systems   Objective:  BP 115/64 mmHg  Pulse 62  Temp(Src) 98.1 F (36.7 C) (Oral)  Resp 18  Ht  (1.727 m)  Wt 158 lb (71.668 kg)  BMI 24.03 kg/m2  SpO2 100%  LMP 02/12/2015 Physical Exam  Pupils are equally round reactive to light extraocular muscles are intact  Heart is regular rate and rhythm lungs are clear to auscultation  Inspection low back reveals some paraspinous muscle tenderness but no overt trigger points. She does have particular tenderness over the right SI joint. She does have pain on extension with left greater than right lateral rotation. When she is in the supine position she has an equivocal straight leg raise on the right and negative on the left. Muscle tone and  bulk is good strength appears to be 5 over 5 both proximal and distal and sensation is intact.     Assessment & Plan:   [name] was seen today for back pain, rectal pain and leg pain.  Diagnoses and all orders for this visit:  DDD (degenerative disc disease), lumbar  Facet arthritis of lumbosacral  region  Midline low back pain with right-sided sciatica    ----------------------------------------------------------------------------------------------------------------------  Problem List Items Addressed This Visit    None    Visit Diagnoses    DDD (degenerative disc disease), lumbar    -  Primary    Facet arthritis of lumbosacral region        Midline low back pain with right-sided sciatica           ----------------------------------------------------------------------------------------------------------------------  1. DDD (degenerative disc disease), lumbar Right side with radiculitis in the L5-S1 distribution  2. Facet arthritis of lumbosacral region Right side  3. Midline low back pain with right-sided sciatica Right greater than left  4. Sacroiliitis with history of leg length discrepancy.    ----------------------------------------------------------------------------------------------------------------------  I am having Ms. Odea maintain her ALPRAZolam, amoxicillin-clavulanate, and citalopram.   No orders of the defined types were placed in this encounter.      Follow-up: Return in about 2 weeks (around 03/05/2015) for procedure. we'll plan on proceeding with an epidural steroid injection as described to her today in full detail. Hopefully this will get her pain under better control. We've gone over the risks and benefits of the procedure and all questions are answered. No guarantees were made. Also want her to continue with physical therapy exercises as discussed with her today. Ultimately we may also consider a facet block or some of the facet genic characteristics to her pain. Yevette EdwardsJames G Denessa Cavan, MD  2

## 2015-03-07 ENCOUNTER — Other Ambulatory Visit: Payer: Self-pay | Admitting: Family Medicine

## 2015-03-09 ENCOUNTER — Ambulatory Visit
Admission: EM | Admit: 2015-03-09 | Discharge: 2015-03-09 | Disposition: A | Discharge status: Home / Self Care | Payer: BLUE CROSS/BLUE SHIELD | Attending: Family Medicine | Admitting: Family Medicine

## 2015-03-09 ENCOUNTER — Encounter: Payer: Self-pay | Admitting: Emergency Medicine

## 2015-03-09 DIAGNOSIS — G44219 Episodic tension-type headache, not intractable: Secondary | ICD-10-CM

## 2015-03-09 DIAGNOSIS — J0110 Acute frontal sinusitis, unspecified: Secondary | ICD-10-CM | POA: Diagnosis not present

## 2015-03-09 DIAGNOSIS — H6991 Unspecified Eustachian tube disorder, right ear: Secondary | ICD-10-CM

## 2015-03-09 DIAGNOSIS — H6981 Other specified disorders of Eustachian tube, right ear: Secondary | ICD-10-CM | POA: Diagnosis not present

## 2015-03-09 MED ORDER — FLUTICASONE PROPIONATE 50 MCG/ACT NA SUSP: 2.0000 | Freq: Every day | NASAL | Status: DC

## 2015-03-09 MED ORDER — AMOXICILLIN-POT CLAVULANATE 875-125 MG PO TABS: 1.0000 | ORAL_TABLET | Freq: Two times a day (BID) | ORAL | Status: DC

## 2015-03-09 MED ORDER — KETOROLAC TROMETHAMINE 60 MG/2ML IM SOLN
60.0000 mg | Freq: Once | INTRAMUSCULAR | Status: AC
  Administered 2015-03-09: 60 mg via INTRAMUSCULAR

## 2015-03-09 NOTE — Discharge Instructions (Signed)
Zyrtec ( ceterizine) 10 mg 1 by mouth twice daily for 3 days, then reduce to daily ....keep on going  Fluticasone I spray per nostril twice daily, reduce to once daily after a few days   Increased fluids - warm steamy showers- drink fluids- void frequently  Augmentin 875mg  1 tablet twice daily for 7 days   Barotitis Media Barotitis media is inflammation of your middle ear. This occurs when the auditory tube (eustachian tube) leading from the back of your nose (nasopharynx) to your eardrum is blocked. This blockage may result from a cold, environmental allergies, or an upper respiratory infection. Unresolved barotitis media may lead to damage or hearing loss (barotrauma), which may become permanent. HOME CARE INSTRUCTIONS   Use medicines as recommended by your health care provider. Over-the-counter medicines will help unblock the canal and can help during times of air travel.  Do not put anything into your ears to clean or unplug them. Eardrops will not be helpful.  Do not swim, dive, or fly until your health care provider says it is all right to do so. If these activities are necessary, chewing gum with frequent, forceful swallowing may help. It is also helpful to hold your nose and gently blow to pop your ears for equalizing pressure changes. This forces air into the eustachian tube.  Only take over-the-counter or prescription medicines for pain, discomfort, or fever as directed by your health care provider.  A decongestant may be helpful in decongesting the middle ear and make pressure equalization easier. SEEK MEDICAL CARE IF:  You experience a serious form of dizziness in which you feel as if the room is spinning and you feel nauseated (vertigo).  Your symptoms only involve one ear. SEEK IMMEDIATE MEDICAL CARE IF:   You develop a severe headache, dizziness, or severe ear pain.  You have bloody or pus-like drainage from your ears.  You develop a fever.  Your problems do not  improve or become worse. MAKE SURE YOU:   Understand these instructions.  Will watch your condition.  Will get help right away if you are not doing well or get worse.   This information is not intended to replace advice given to you by your health care provider. Make sure you discuss any questions you have with your health care provider.   Document Released: 02/05/2000 Document Revised: 11/28/2012 Document Reviewed: 09/04/2012 Elsevier Interactive Patient Education [employer]2016 [employer]

## 2015-03-09 NOTE — ED Notes (Signed)
Patient c/o headache , sinus pain and pressure, and neck pain for 4 days.

## 2015-03-09 NOTE — ED Provider Notes (Signed)
CSN: [id number]     Arrival date & time 03/09/15  1359 History   First MD Initiated Contact with Patient 03/09/15 1630     Chief Complaint  Patient presents with  . Headache   (Consider location/radiation/quality/duration/timing/severity/associated sxs/prior Treatment) HPI [age] yo F with pain frontal and maxillary sinuses x 3 days. Felt "a cold coming on" late last week. Right side severe, left side moderate ,around eye and  Across bridge of nose.Ears feel full, Teeth ache. Headache, hatband headache with neck muscle aching. Has taken nothing for pain today. Uses tylenol and motrin at home when needed- usually avoids meds."Can hear my heartbeat"- upset her..  Did not get flu shot . Drove self here with young daughter. Has had previous care with Dr Elenore RotaJuengel- with dilation and exploration of right nare and right sinuses by her report. Has not seen him recently -no routine visit scheduled Past Medical History  Diagnosis Date  . Anxiety   . Depression    Past Surgical History  Procedure Laterality Date  . Tubal ligation    . Tonsillectomy    . Femur fracture surgery Right 1980's   History reviewed. No pertinent family history. Social History  Substance Use Topics  . Smoking status: Never Smoker   . Smokeless tobacco: Never Used  . Alcohol Use: No   OB History    No data available     Review of Systems 10 Systems reviewed and are negative for acute change except as noted in the HPI.  Allergies  Review of patient's allergies indicates no known allergies.  Home Medications   Prior to Admission medications   Medication Sig Start Date End Date Taking? Authorizing Provider  ALPRAZolam (XANAX) 0.25 MG tablet Take 1 tablet (0.25 mg total) by mouth 2 (two) times daily as needed for anxiety. Patient not taking: Reported on 02/19/2015 11/25/14   Ellyn HackSyed Asad A Shah, MD  amoxicillin-clavulanate (AUGMENTIN) 875-125 MG tablet Take 1 tablet by mouth 2 (two) times daily. 03/09/15   Rae HalstedLaurie W Zenaya Ulatowski,  PA-C  citalopram (CELEXA) 20 MG tablet TAKE 1 TABLET (20 MG TOTAL) BY MOUTH DAILY. 03/08/15   Ellyn HackSyed Asad A Shah, MD  fluticasone (FLONASE) 50 MCG/ACT nasal spray Place 2 sprays into both nostrils daily. 03/09/15   Rae HalstedLaurie W Nanako Stopher, PA-C   Meds Ordered and Administered this Visit   Medications  ketorolac (TORADOL) injection 60 mg (60 mg Intramuscular Given 03/09/15 1645)  MUCH improved with warm face wrap and Rx- up and active in room and very relieved  BP 118/55 mmHg  Pulse 81  Temp(Src) 98.3 F (36.8 C) (Oral)  Resp 16  Ht 5' 8.5" (1.74 m)  Wt 157 lb (71.215 kg)  BMI 23.52 kg/m2  SpO2 100%  LMP 02/11/2015 (Approximate) No data found.   Physical Exam   VS noted, WNL GENERAL : frontal sinus pain, headache, teeth ache--reports pain level 8 HEENT: no pharyngeal erythema,no exudate, no erythema of TMs, mildly retracted,no cervical LAD; PERRLA; EOMI, conjunctiva clear, thump tenderness peri-orbital,nasal bridge, denies visual disturbance RESP: CTA  B , no wheezing, no accessory muscle use CARD: RRR ABD: Not distended NEURO: Good attention, good recall, no gross neuro defecit, CNs grossly intact PSYCH: speech and behavior appropriate, ambulatory, drove here ED Course  Procedures (including critical care time)  Labs Review Labs Reviewed - No data to display  Imaging Review No results found.    MDM   1. Acute frontal sinusitis, recurrence not specified   2. Episodic tension-type headache, not intractable  Plan: Test/x-ray results and diagnosis reviewed with patient  Rx as per orders;  benefits, risks, potential side effects reviewed  Facial warm packs, increased hydration, treat sinus/allergies Add antibiotic given ENT hx of sinus issues and right sided  focal pain  Recommend supportive treatment with cyclic tylenol and ibuprofen Seek additional medical care if symptoms worsen or are not improving  Discharge Medication List as of 03/09/2015  6:03 PM    START taking these  medications   Details  fluticasone (FLONASE) 50 MCG/ACT nasal spray Place 2 sprays into both nostrils daily., Starting 03/09/2015, Until Discontinued, Normal      Augmentin 875 1 BID Zyrtec 10 mg    Rae Halsted, PA-C 03/13/15 2239

## 2015-03-19 ENCOUNTER — Ambulatory Visit: Payer: Managed Care, Other (non HMO) | Admitting: Anesthesiology

## 2015-04-16 ENCOUNTER — Encounter: Payer: Self-pay | Admitting: Family Medicine

## 2015-04-16 ENCOUNTER — Ambulatory Visit (INDEPENDENT_AMBULATORY_CARE_PROVIDER_SITE_OTHER): Payer: BLUE CROSS/BLUE SHIELD | Admitting: Family Medicine

## 2015-04-16 VITALS — BP 118/62 | HR 94 | Temp 98.9°F | Resp 18 | Ht 69.0 in | Wt 158.6 lb

## 2015-04-16 DIAGNOSIS — F329 Major depressive disorder, single episode, unspecified: Secondary | ICD-10-CM | POA: Diagnosis not present

## 2015-04-16 DIAGNOSIS — F32A Depression, unspecified: Secondary | ICD-10-CM

## 2015-04-16 DIAGNOSIS — J320 Chronic maxillary sinusitis: Secondary | ICD-10-CM | POA: Diagnosis not present

## 2015-04-16 MED ORDER — CLINDAMYCIN HCL 300 MG PO CAPS: 300.0000 mg | ORAL_CAPSULE | Freq: Four times a day (QID) | ORAL | Status: DC

## 2015-04-16 NOTE — Progress Notes (Signed)
Name: Susan Garner   MRN: [id number]    DOB: [birth year]   Date:04/16/2015       Progress Note  Subjective  Chief Complaint  Chief Complaint  Patient presents with  . Follow-up    Discuss Medication (Celexa)    Depression      The patient presents with depression.  This is a chronic problem.  The onset quality is gradual.   The problem has been gradually worsening since onset.  Associated symptoms include fatigue, irritable, decreased interest, body aches, headaches and sad.( Does not like her job, frustrated with changing procedures, does not want to get up in the morning, feels helpless.)  Past treatments include SSRIs - Selective serotonin reuptake inhibitors.  Compliance with treatment is good.  Previous treatment provided mild relief.  Past medical history includes depression.    records from [facility], psychiatry, and patient's previous visits with [place] reviewed. Has long history of treatment resistant depression and has failed multiple SSRI and non-SSRI therapies. Currently on Celexa, which is not effective, still has periods of extreme sadness, fatigue, irritability, and anhedonia.   Sinus Headaches Pt. Presents for recurrent and persistent sinus headaches, pressure over her sinuses, nasal congestion. Present for over 3 months, started when she had sinusitis in November, was treated with Z-pak. She feels like her symptoms never completely went away. Was seen last month for similar complaints of pressure over the sinuses, and headaches and was started on Augmentin. She reports her symptoms did not improve on Augmentin. Frequently blows her nose and at other times she feels her nose is stuffed up, has a chronic cough, drainage in her throat. Denies Fevers or chills.    Past Medical History  Diagnosis Date  . Anxiety   . Depression     Past Surgical History  Procedure Laterality Date  . Tubal ligation    . Tonsillectomy    . Femur fracture surgery Right 1980's     History reviewed. No pertinent family history.  Social History   Social History  . Marital Status: Single    Spouse Name: N/A  . Number of Children: N/A  . Years of Education: N/A   Occupational History  . Not on file.   Social History Main Topics  . Smoking status: Never Smoker   . Smokeless tobacco: Never Used  . Alcohol Use: No  . Drug Use: No  . Sexual Activity: No   Other Topics Concern  . Not on file   Social History Narrative     Current outpatient prescriptions:  .  citalopram (CELEXA) 20 MG tablet, TAKE 1 TABLET (20 MG TOTAL) BY MOUTH DAILY., Disp: 30 tablet, Rfl: 0 .  ALPRAZolam (XANAX) 0.25 MG tablet, Take 1 tablet (0.25 mg total) by mouth 2 (two) times daily as needed for anxiety. (Patient not taking: Reported on 02/19/2015), Disp: 60 tablet, Rfl: 0 .  amoxicillin-clavulanate (AUGMENTIN) 875-125 MG tablet, Take 1 tablet by mouth 2 (two) times daily. (Patient not taking: Reported on 04/16/2015), Disp: 14 tablet, Rfl: 0 .  fluticasone (FLONASE) 50 MCG/ACT nasal spray, Place 2 sprays into both nostrils daily. (Patient not taking: Reported on 04/16/2015), Disp: 16 g, Rfl: 2  No Known Allergies   Review of Systems  Constitutional: Positive for fatigue.  HENT: Negative for congestion and sore throat.   Musculoskeletal: Positive for back pain.  Neurological: Positive for headaches.  Psychiatric/Behavioral: Positive for depression. The patient is nervous/anxious.     Objective  Filed Vitals:   04/16/15  1426  BP: 118/62  Pulse: 94  Temp: 98.9 F (37.2 C)  TempSrc: Oral  Resp: 18  Height:  (1.753 m)  Weight: 158 lb 9.6 oz (71.94 kg)  SpO2: 97%    Physical Exam  Constitutional: She is oriented to person, place, and time and well-developed, well-nourished, and in no distress. She is irritable.  HENT:  Head: Normocephalic and atraumatic.  Right Ear: Tympanic membrane and ear canal normal.  Left Ear: Tympanic membrane and ear canal normal.   Mouth/Throat: Posterior oropharyngeal erythema present.  Nasal mucosa inflamed, erythematous, turbinate hypertrophy.  Cardiovascular: Normal rate and regular rhythm.   Pulmonary/Chest: Effort normal and breath sounds normal.  Abdominal: Soft. Bowel sounds are normal.  Neurological: She is alert and oriented to person, place, and time.  Psychiatric: Mood, memory, affect and judgment normal.  Nursing note and vitals reviewed.   Assessment & Plan  1. Depression Recommended patient to start tapering off Celexa and return in one week to be started on a different antidepressant therapy. To that end, reviewed multiple medications including amitriptyline, which is effective for depression and symptoms of fibromyalgia (patient has chronic low back pain). Review and follow-up in one week.  2. Chronic maxillary sinusitis Records from urgent care reviewed. Patient has failed Z-Pak and Augmentin for treatment of sinusitis. Will start on clindamycin and follow-up in one week. - clindamycin (CLEOCIN) 300 MG capsule; Take 1 capsule (300 mg total) by mouth 4 (four) times daily.  Dispense: 40 capsule; Refill: 0   Creta Dorame Asad A. [facility] 04/16/2015 2:39 PM

## 2015-04-27 ENCOUNTER — Ambulatory Visit: Payer: Self-pay | Admitting: Family Medicine

## 2015-05-12 ENCOUNTER — Ambulatory Visit: Payer: BLUE CROSS/BLUE SHIELD | Admitting: Anesthesiology

## 2015-05-12 ENCOUNTER — Ambulatory Visit (INDEPENDENT_AMBULATORY_CARE_PROVIDER_SITE_OTHER): Payer: BLUE CROSS/BLUE SHIELD | Admitting: Family Medicine

## 2015-05-12 ENCOUNTER — Encounter: Payer: Self-pay | Admitting: Family Medicine

## 2015-05-12 VITALS — BP 104/68 | HR 84 | Temp 98.3°F | Resp 18 | Ht 69.0 in | Wt 157.1 lb

## 2015-05-12 DIAGNOSIS — F32A Depression, unspecified: Secondary | ICD-10-CM

## 2015-05-12 DIAGNOSIS — F329 Major depressive disorder, single episode, unspecified: Secondary | ICD-10-CM | POA: Diagnosis not present

## 2015-05-12 DIAGNOSIS — F419 Anxiety disorder, unspecified: Secondary | ICD-10-CM | POA: Diagnosis not present

## 2015-05-12 MED ORDER — AMITRIPTYLINE HCL 75 MG PO TABS: 75.0000 mg | ORAL_TABLET | Freq: Every day | ORAL | Status: DC

## 2015-05-12 MED ORDER — ALPRAZOLAM 0.25 MG PO TABS: 0.2500 mg | ORAL_TABLET | Freq: Two times a day (BID) | ORAL | Status: DC | PRN

## 2015-05-12 NOTE — Progress Notes (Signed)
Name: Susan ChestnutBrandy Tara Savannah   MRN: [id number]    DOB: 29-May-1970   Date:05/13/2015       Progress Note  Subjective  Chief Complaint  Chief Complaint  Patient presents with  . Fibromyalgia    medication changes and EKG    HPI  Depression: Pt. Is here for follow up of Depression and medication change. She has tapered off Citalopram since her last visit. Her symptoms of depression include aches, anhedonia, fatigue, poor concentration.  She has tried multiple anti-depressants in the past, has noted no improvement with most of them.   Past Medical History  Diagnosis Date  . Anxiety   . Depression     Past Surgical History  Procedure Laterality Date  . Tubal ligation    . Tonsillectomy    . Femur fracture surgery Right 1980's    No family history on file.  Social History   Social History  . Marital Status: Single    Spouse Name: N/A  . Number of Children: N/A  . Years of Education: N/A   Occupational History  . Not on file.   Social History Main Topics  . Smoking status: Never Smoker   . Smokeless tobacco: Never Used  . Alcohol Use: No  . Drug Use: No  . Sexual Activity: No   Other Topics Concern  . Not on file   Social History Narrative     Current outpatient prescriptions:  .  ALPRAZolam (XANAX) 0.25 MG tablet, Take 1 tablet (0.25 mg total) by mouth 2 (two) times daily as needed for anxiety., Disp: 60 tablet, Rfl: 0 .  amitriptyline (ELAVIL) 75 MG tablet, Take 1 tablet (75 mg total) by mouth at bedtime., Disp: 30 tablet, Rfl: 0  No Known Allergies   Review of Systems  Eyes: Negative for blurred vision and double vision.  Neurological: Negative for headaches.  Psychiatric/Behavioral: Positive for depression. The patient is nervous/anxious and has insomnia.      Objective  Filed Vitals:   05/12/15 0941  BP: 104/68  Pulse: 84  Temp: 98.3 F (36.8 C)  Resp: 18  Height: 5\' 9"  (1.753 m)  Weight: 157 lb 1 oz (71.243 kg)  SpO2: 98%    Physical  Exam  Constitutional: She is oriented to person, place, and time and well-developed, well-nourished, and in no distress.  HENT:  Head: Normocephalic and atraumatic.  Cardiovascular: Normal rate and regular rhythm.   Pulmonary/Chest: Effort normal and breath sounds normal.  Neurological: She is alert and oriented to person, place, and time.  Psychiatric: Memory, affect and judgment normal.  Nursing note and vitals reviewed.     Assessment & Plan  1. Depression Patient will be started on a meat trip to lean 75 mg at bedtime, this should help with symptoms of anxiety, depression, and headaches. Patient educated in detail about possible adverse effects, advised to contact us or seek immediate medical attention if she experiences any adverse effects. EKG obtained prior to initiation of amitriptyline.  - Electrocardiogram report - amitriptyline (ELAVIL) 75 MG tablet; Take 1 tablet (75 mg total) by mouth at bedtime.  Dispense: 30 tablet; Refill: 0  2. Anxiety  - ALPRAZolam (XANAX) 0.25 MG tablet; Take 1 tablet (0.25 mg total) by mouth 2 (two) times daily as needed for anxiety.  Dispense: 60 tablet; Refill: 0   Henrick Mcgue Asad A. [facility] 05/13/2015 5:14 PM

## 2015-06-16 ENCOUNTER — Ambulatory Visit: Payer: Self-pay | Admitting: Family Medicine

## 2015-06-22 ENCOUNTER — Ambulatory Visit
Admission: RE | Admit: 2015-06-22 | Discharge: 2015-06-22 | Disposition: A | Discharge status: Home / Self Care | Payer: BLUE CROSS/BLUE SHIELD | Source: Ambulatory Visit | Attending: Obstetrics and Gynecology | Admitting: Obstetrics and Gynecology

## 2015-06-22 DIAGNOSIS — Z1231 Encounter for screening mammogram for malignant neoplasm of breast: Secondary | ICD-10-CM | POA: Diagnosis present

## 2015-07-21 ENCOUNTER — Encounter: Payer: Self-pay | Admitting: Emergency Medicine

## 2015-07-21 ENCOUNTER — Emergency Department: Payer: BLUE CROSS/BLUE SHIELD

## 2015-07-21 ENCOUNTER — Emergency Department
Admission: EM | Admit: 2015-07-21 | Discharge: 2015-07-21 | Disposition: A | Discharge status: Home / Self Care | Payer: BLUE CROSS/BLUE SHIELD | Attending: Emergency Medicine | Admitting: Emergency Medicine

## 2015-07-21 DIAGNOSIS — N23 Unspecified renal colic: Secondary | ICD-10-CM

## 2015-07-21 DIAGNOSIS — R1031 Right lower quadrant pain: Secondary | ICD-10-CM

## 2015-07-21 DIAGNOSIS — F329 Major depressive disorder, single episode, unspecified: Secondary | ICD-10-CM | POA: Insufficient documentation

## 2015-07-21 DIAGNOSIS — N83201 Unspecified ovarian cyst, right side: Secondary | ICD-10-CM

## 2015-07-21 LAB — CBC WITH DIFFERENTIAL/PLATELET
BASOS ABS: 0 10*3/uL (ref 0–0.1)
EOS ABS: 0.1 10*3/uL (ref 0–0.7)
HCT: 37.6 % (ref 35.0–47.0)
Hemoglobin: 13.1 g/dL (ref 12.0–16.0)
Lymphs Abs: 1.2 10*3/uL (ref 1.0–3.6)
MCH: 30.3 pg (ref 26.0–34.0)
MCHC: 34.8 g/dL (ref 32.0–36.0)
MCV: 87.2 fL (ref 80.0–100.0)
MONO ABS: 0.6 10*3/uL (ref 0.2–0.9)
Monocytes Relative: 7 %
Neutro Abs: 6.1 10*3/uL (ref 1.4–6.5)
Neutrophils Relative %: 75 %
Platelets: 254 10*3/uL (ref 150–440)
RBC: 4.32 MIL/uL (ref 3.80–5.20)
RDW: 12.3 % (ref 11.5–14.5)
WBC: 8 10*3/uL (ref 3.6–11.0)

## 2015-07-21 LAB — COMPREHENSIVE METABOLIC PANEL
ALK PHOS: 52 U/L (ref 38–126)
ALT: 10 U/L — ABNORMAL LOW (ref 14–54)
ANION GAP: 8 (ref 5–15)
AST: 17 U/L (ref 15–41)
Albumin: 4.5 g/dL (ref 3.5–5.0)
BILIRUBIN TOTAL: 0.7 mg/dL (ref 0.3–1.2)
BUN: 10 mg/dL (ref 6–20)
CALCIUM: 9 mg/dL (ref 8.9–10.3)
CO2: 22 mmol/L (ref 22–32)
Chloride: 108 mmol/L (ref 101–111)
Creatinine, Ser: 0.84 mg/dL (ref 0.44–1.00)
GFR calc non Af Amer: >60 mL/min (ref >60)
Glucose, Bld: 104 mg/dL — ABNORMAL HIGH (ref 65–99)
Potassium: 3.4 mmol/L — ABNORMAL LOW (ref 3.5–5.1)
Sodium: 138 mmol/L (ref 135–145)
TOTAL PROTEIN: 7.3 g/dL (ref 6.5–8.1)

## 2015-07-21 LAB — URINALYSIS COMPLETE WITH MICROSCOPIC (ARMC ONLY)
BILIRUBIN URINE: NEGATIVE
GLUCOSE, UA: NEGATIVE mg/dL
Ketones, ur: NEGATIVE mg/dL
NITRITE: NEGATIVE
Protein, ur: 30 mg/dL — AB
SPECIFIC GRAVITY, URINE: 1.008 (ref 1.005–1.030)
pH: 7 (ref 5.0–8.0)

## 2015-07-21 LAB — LIPASE, BLOOD: Lipase: 34 U/L (ref 11–51)

## 2015-07-21 LAB — PREGNANCY, URINE: Preg Test, Ur: NEGATIVE

## 2015-07-21 MED ORDER — OXYCODONE-ACETAMINOPHEN 5-325 MG PO TABS: 1.0000 | ORAL_TABLET | Freq: Four times a day (QID) | ORAL | Status: DC | PRN

## 2015-07-21 MED ORDER — KETOROLAC TROMETHAMINE 30 MG/ML IJ SOLN
30.0000 mg | Freq: Once | INTRAMUSCULAR | Status: AC
  Administered 2015-07-21: 30 mg via INTRAVENOUS
  Filled 2015-07-21: qty 1

## 2015-07-21 MED ORDER — ONDANSETRON 4 MG PO TBDP: ORAL_TABLET | ORAL | Status: DC

## 2015-07-21 MED ORDER — SODIUM CHLORIDE 0.9 % IV BOLUS (SEPSIS)
1000.0000 mL | Freq: Once | INTRAVENOUS | Status: AC
  Administered 2015-07-21: 1000 mL via INTRAVENOUS

## 2015-07-21 MED ORDER — ONDANSETRON HCL 4 MG/2ML IJ SOLN
4.0000 mg | Freq: Once | INTRAMUSCULAR | Status: AC
  Administered 2015-07-21: 4 mg via INTRAVENOUS
  Filled 2015-07-21: qty 2

## 2015-07-21 MED ORDER — IBUPROFEN 800 MG PO TABS: 800.0000 mg | ORAL_TABLET | Freq: Three times a day (TID) | ORAL | Status: DC | PRN

## 2015-07-21 MED ORDER — TAMSULOSIN HCL 0.4 MG PO CAPS: 0.4000 mg | ORAL_CAPSULE | Freq: Every day | ORAL | Status: DC

## 2015-07-21 NOTE — ED Provider Notes (Signed)
CSN: [id number]     Arrival date & time 07/21/15  0821 History   First MD Initiated Contact with Patient 07/21/15 0831     Chief Complaint  Patient presents with  . Abdominal Pain     (Consider location/radiation/quality/duration/timing/severity/associated sxs/prior Treatment) The history is provided by the patient.  Susan Garner is a 45 y.o. female hx of anxiety, C diff here with abdominal pain, back pain. Patient states that she had acute onset of right pelvic and right lower quadrant pain this morning. She states that no position is comfortable and she was writhing around for about 20 minutes and the pain subsided. She was nauseated and vomited once today. She said this similar to her previous ovarian cyst but a little bit more severe. She never has a history of kidney stones and denies any blood in her urine. She has a history of C. difficile colitis and was admitted in 2009 but has a normal bowel movement no diarrhea this morning and not recently on antibiotics. She denies any fevers or chills.       Past Medical History  Diagnosis Date  . Anxiety   . Depression    Past Surgical History  Procedure Laterality Date  . Tubal ligation    . Tonsillectomy    . Femur fracture surgery Right 1980's   No family history on file. Social History  Substance Use Topics  . Smoking status: Never Smoker   . Smokeless tobacco: Never Used  . Alcohol Use: No   OB History    No data available     Review of Systems  Gastrointestinal: Positive for abdominal pain.  All other systems reviewed and are negative.     Allergies  Review of patient's allergies indicates no known allergies.  Home Medications   Prior to Admission medications   Medication Sig Start Date End Date Taking? Authorizing Provider  ALPRAZolam (XANAX) 0.25 MG tablet Take 1 tablet (0.25 mg total) by mouth 2 (two) times daily as needed for anxiety. Patient not taking: Reported on 07/21/2015 05/12/15   Ellyn Hack, MD  amitriptyline (ELAVIL) 75 MG tablet Take 1 tablet (75 mg total) by mouth at bedtime. Patient not taking: Reported on 07/21/2015 05/12/15   Ellyn Hack, MD   BP 117/58 mmHg  Pulse 75  Temp(Src) 98.2 F (36.8 C) (Oral)  Resp 18  Ht  (1.727 m)  Wt 163 lb (73.936 kg)  BMI 24.79 kg/m2  SpO2 99%  LMP 07/17/2015 (Exact Date) Physical Exam  Constitutional: She is oriented to person, place, and time. She appears well-developed.  Slightly uncomfortable   HENT:  Head: Normocephalic.  Mouth/Throat: Oropharynx is clear and moist.  Eyes: Conjunctivae are normal. Pupils are equal, round, and reactive to light.  Neck: Normal range of motion. Neck supple.  Cardiovascular: Normal rate, regular rhythm and normal heart sounds.   Pulmonary/Chest: Effort normal and breath sounds normal. No respiratory distress. She has no wheezes. She has no rales.  Abdominal: Soft. Bowel sounds are normal.  + RLQ tenderness, minimal RUQ tenderness, no murphy's sign. No periumbilical tenderness   Musculoskeletal: Normal range of motion. She exhibits no edema or tenderness.  Neurological: She is alert and oriented to person, place, and time.  Skin: Skin is warm and dry.  Psychiatric: She has a normal mood and affect. Her behavior is normal. Judgment and thought content normal.  Nursing note and vitals reviewed.   ED Course  Procedures (including critical care  time) Labs Review Labs Reviewed  COMPREHENSIVE METABOLIC PANEL - Abnormal; Notable for the following:    Potassium 3.4 (*)    Glucose, Bld 104 (*)    ALT 10 (*)    All other components within normal limits  URINALYSIS COMPLETEWITH MICROSCOPIC ([facility] ONLY) - Abnormal; Notable for the following:    Color, Urine YELLOW (*)    APPearance HAZY (*)    Hgb urine dipstick 3+ (*)    Protein, ur 30 (*)    Leukocytes, UA 1+ (*)    Bacteria, UA FEW (*)    Squamous Epithelial / LPF 0-5 (*)    All other components within normal limits  CBC WITH  DIFFERENTIAL/PLATELET  PREGNANCY, URINE  LIPASE, BLOOD    Imaging Review Dg Abd 1 View  07/21/2015  CLINICAL DATA:  Acute onset of right lower quadrant abdominal pain and right flank pain earlier today. EXAM: ABDOMEN - 1 VIEW COMPARISON:  CT abdomen pelvis 07/10/2006 and earlier. FINDINGS: Bowel gas pattern unremarkable without evidence of obstruction or significant ileus. Moderate stool burden in the colon. Approximate 5 mm calcification projected over the right transverse process of L4, likely within the proximal right ureter. No other visible opaque urinary tract calculi. Mild degenerative changes at the lumbosacral junction. IMPRESSION: Approximate 5 mm proximal right ureteral calculus. Electronically Signed   By: Hulan Saas M.D.   On: 07/21/2015 11:28   US Transvaginal Non-ob  07/21/2015  CLINICAL DATA:  Right lower quadrant pain for 1 day EXAM: TRANSABDOMINAL AND TRANSVAGINAL ULTRASOUND OF PELVIS DOPPLER ULTRASOUND OF OVARIES TECHNIQUE: Both transabdominal and transvaginal ultrasound examinations of the pelvis were performed. Transabdominal technique was performed for global imaging of the pelvis including uterus, ovaries, adnexal regions, and pelvic cul-de-sac. It was necessary to proceed with endovaginal exam following the transabdominal exam to visualize the endometrium. Color and duplex Doppler ultrasound was utilized to evaluate blood flow to the ovaries. COMPARISON:  None. FINDINGS: Uterus Measurements: 10.0 x 4.3 x 7.2 cm. Multiple nabothian cysts are noted. Endometrium Thickness: 10 mm.  No focal abnormality visualized. Right ovary Measurements: 4.2 x 2.6 x 3.1 cm. Multiple ovarian cysts are noted. The largest of these measures 2.3 cm in greatest dimension. Left ovary Measurements: 2.5 x 1.4 x 1.7 cm. Normal appearance/no adnexal mass. Pulsed Doppler evaluation of both ovaries demonstrates normal low-resistance arterial and venous waveforms. Other findings Minimal free fluid is noted.  Given the patient's symptomatology this could be related to a ruptured cyst. IMPRESSION: Multiple right ovarian cysts. Given the clinical symptomatology the small amount of free pelvic fluid could be related to a ruptured cyst. Electronically Signed   By: Alcide Clever M.D.   On: 07/21/2015 11:03   US Pelvis Complete  07/21/2015  CLINICAL DATA:  Right lower quadrant pain for 1 day EXAM: TRANSABDOMINAL AND TRANSVAGINAL ULTRASOUND OF PELVIS DOPPLER ULTRASOUND OF OVARIES TECHNIQUE: Both transabdominal and transvaginal ultrasound examinations of the pelvis were performed. Transabdominal technique was performed for global imaging of the pelvis including uterus, ovaries, adnexal regions, and pelvic cul-de-sac. It was necessary to proceed with endovaginal exam following the transabdominal exam to visualize the endometrium. Color and duplex Doppler ultrasound was utilized to evaluate blood flow to the ovaries. COMPARISON:  None. FINDINGS: Uterus Measurements: 10.0 x 4.3 x 7.2 cm. Multiple nabothian cysts are noted. Endometrium Thickness: 10 mm.  No focal abnormality visualized. Right ovary Measurements: 4.2 x 2.6 x 3.1 cm. Multiple ovarian cysts are noted. The largest of these measures 2.3 cm in greatest  dimension. Left ovary Measurements: 2.5 x 1.4 x 1.7 cm. Normal appearance/no adnexal mass. Pulsed Doppler evaluation of both ovaries demonstrates normal low-resistance arterial and venous waveforms. Other findings Minimal free fluid is noted. Given the patient's symptomatology this could be related to a ruptured cyst. IMPRESSION: Multiple right ovarian cysts. Given the clinical symptomatology the small amount of free pelvic fluid could be related to a ruptured cyst. Electronically Signed   By: Alcide Clever M.D.   On: 07/21/2015 11:03   US Renal  07/21/2015  CLINICAL DATA:  Right flank pain.  Rule out hydronephrosis. EXAM: RENAL / URINARY TRACT ULTRASOUND COMPLETE COMPARISON:  CT abdomen pelvis 07/10/2006 FINDINGS:  Right Kidney: Length: 10.5 cm. Mild fullness left renal pelvis versus extra renal pelvis. No definite hydronephrosis. 6.9 mm shadowing stone midpole which is not causing obstruction. Left Kidney: Length: 11.3 cm. Echogenicity within normal limits. No mass or hydronephrosis visualized. Bladder: Normal bladder.  Bilateral ureteral jets identified. IMPRESSION: 6.9 mm nonobstructing stone right midpole. Possible mild fullness right renal pelvis without definite hydronephrosis Bilateral ureteral jets identified. Electronically Signed   By: Marlan Palau M.D.   On: 07/21/2015 11:03   [place] Art/ven Flow Abd Pelv Doppler  07/21/2015  CLINICAL DATA:  Right lower quadrant pain for 1 day EXAM: TRANSABDOMINAL AND TRANSVAGINAL ULTRASOUND OF PELVIS DOPPLER ULTRASOUND OF OVARIES TECHNIQUE: Both transabdominal and transvaginal ultrasound examinations of the pelvis were performed. Transabdominal technique was performed for global imaging of the pelvis including uterus, ovaries, adnexal regions, and pelvic cul-de-sac. It was necessary to proceed with endovaginal exam following the transabdominal exam to visualize the endometrium. Color and duplex Doppler ultrasound was utilized to evaluate blood flow to the ovaries. COMPARISON:  None. FINDINGS: Uterus Measurements: 10.0 x 4.3 x 7.2 cm. Multiple nabothian cysts are noted. Endometrium Thickness: 10 mm.  No focal abnormality visualized. Right ovary Measurements: 4.2 x 2.6 x 3.1 cm. Multiple ovarian cysts are noted. The largest of these measures 2.3 cm in greatest dimension. Left ovary Measurements: 2.5 x 1.4 x 1.7 cm. Normal appearance/no adnexal mass. Pulsed Doppler evaluation of both ovaries demonstrates normal low-resistance arterial and venous waveforms. Other findings Minimal free fluid is noted. Given the patient's symptomatology this could be related to a ruptured cyst. IMPRESSION: Multiple right ovarian cysts. Given the clinical symptomatology the small amount of free pelvic  fluid could be related to a ruptured cyst. Electronically Signed   By: Alcide Clever M.D.   On: 07/21/2015 11:03   I have personally reviewed and evaluated these images and lab results as part of my medical decision-making.   EKG Interpretation None      MDM   Final diagnoses:  RLQ abdominal pain  RLQ abdominal pain   Susan Garner is a 45 y.o. female here with RLQ and R pelvic pain. Likely ovarian cyst vs renal colic, less likely appy given character of the pain. Will get ovarian US and renal US. If there is no cyst or hydro, then will get CT ab/pel.   12:10 PM US showed multiple R ovarian cyst with some free fluid, likely ruptured cyst, but there is no torsion. Also has R 6.9 mm nonobstructing stone and mild hydro. Xray showed 5 mm proximal stone. I think pain likely from ruptured ovarian cyst and renal colic. WBC nl. Abdomen nontender now, I doubt appy as she has two reasons for her pain. Will refer to her to see GYN and urology for follow up. Will dc home with motrin, percocet, flomax, prn  zofran.     Richardean Canalavid H Jazmine Heckman, MD 07/21/15 [phone]

## 2015-07-21 NOTE — ED Notes (Signed)
Pt presents with RLQ pain started this am with some vomiting.

## 2015-07-21 NOTE — Discharge Instructions (Signed)
Take motrin for pain.   Take zofran for nausea.   Stay hydrated.   Take flomax daily as it will help you urinate out the kidney stone.   Take percocet for severe pain.   See GYN doctor and urologist   Return to ER if you have severe abdominal pain, vomiting, fevers.

## 2015-07-21 NOTE — ED Notes (Signed)
Pt presents with RLQ pain and vomiting this morning. Pt states she has hx of ovarian cyst, but this is different. States pain is from belly button to right side." Pt hx of colitis and c-diff (2009). States she had solid BM this morning.

## 2015-07-22 ENCOUNTER — Ambulatory Visit (INDEPENDENT_AMBULATORY_CARE_PROVIDER_SITE_OTHER): Payer: BLUE CROSS/BLUE SHIELD | Admitting: Obstetrics and Gynecology

## 2015-07-22 ENCOUNTER — Encounter: Payer: Self-pay | Admitting: Obstetrics and Gynecology

## 2015-07-22 VITALS — BP 114/72 | HR 86 | Ht 68.5 in | Wt 162.5 lb

## 2015-07-22 DIAGNOSIS — N83201 Unspecified ovarian cyst, right side: Secondary | ICD-10-CM | POA: Diagnosis not present

## 2015-07-22 DIAGNOSIS — R102 Pelvic and perineal pain unspecified side: Secondary | ICD-10-CM

## 2015-07-22 DIAGNOSIS — N200 Calculus of kidney: Secondary | ICD-10-CM | POA: Diagnosis not present

## 2015-07-22 DIAGNOSIS — R109 Unspecified abdominal pain: Secondary | ICD-10-CM | POA: Diagnosis not present

## 2015-07-22 DIAGNOSIS — R10A1 Flank pain, right side: Secondary | ICD-10-CM

## 2015-07-22 LAB — POCT URINALYSIS DIPSTICK
BILIRUBIN UA: NEGATIVE
Glucose, UA: NEGATIVE
Ketones, UA: NEGATIVE
LEUKOCYTES UA: NEGATIVE
NITRITE UA: NEGATIVE
PH UA: 5
Spec Grav, UA: >=1.03
UROBILINOGEN UA: 0.2

## 2015-07-22 NOTE — Progress Notes (Signed)
GYN ENCOUNTER NOTE  Subjective:       Susan Garner is a 45 y.o. 621P1001 female is here for gynecologic evaluation of the following issues:  1. ER follow-up 2. Right ovarian cyst 3. Renal stones  Patient presents in follow-up from the emergency room yesterday. Workup demonstrated a 6.9 mm right renal pelvis stone without significant hydroureter. Bilateral ureteral jets were seen on ultrasound Ultrasound of pelvis didn't demonstrate a 2.5 cm simple right ovarian cyst. There was minimal free fluid in the cul-de-sac suspicious for possible ruptured ovarian cyst.  Patient is still experiencing right pelvic sidewall pain as well as right flank pain. She is taking hydrocodone which is incompletely managing her pain.  Gynecologic history is negative for significant dysmenorrhea, deep thrusting dyspareunia, or history of endometriosis. The patient does not need to take chronic medications for dysmenorrhea. The patient will take occasional Midol if cramps are moderate..     Gynecologic History Patient's last menstrual period was 07/17/2015 (exact date). Contraception: tubal ligation   Obstetric History OB History  Gravida Para Term Preterm AB SAB TAB Ectopic Multiple Living  1 1 1       1     # Outcome Date GA Lbr Len/2nd Weight Sex Delivery Anes PTL Lv  1 Term 2006   8 lb 12.8 oz (3.992 kg) F Vag-Spont   Y      Past Medical History  Diagnosis Date  . Anxiety   . Depression   . Ovarian cyst   . Kidney stones     Past Surgical History  Procedure Laterality Date  . Tubal ligation    . Tonsillectomy    . Femur fracture surgery Right 1980's    Current Outpatient Prescriptions on File Prior to Visit  Medication Sig Dispense Refill  . ibuprofen (ADVIL,MOTRIN) 800 MG tablet Take 1 tablet (800 mg total) by mouth every 8 (eight) hours as needed. 30 tablet 0  . oxyCODONE-acetaminophen (PERCOCET) 5-325 MG tablet Take 1-2 tablets by mouth every 6 (six) hours as needed. 10 tablet 0   . tamsulosin (FLOMAX) 0.4 MG CAPS capsule Take 1 capsule (0.4 mg total) by mouth daily. 10 capsule 0   No current facility-administered medications on file prior to visit.    No Known Allergies  Social History   Social History  . Marital Status: Single    Spouse Name: N/A  . Number of Children: N/A  . Years of Education: N/A   Occupational History  . Not on file.   Social History Main Topics  . Smoking status: Never Smoker   . Smokeless tobacco: Never Used  . Alcohol Use: No  . Drug Use: No  . Sexual Activity: No   Other Topics Concern  . Not on file   Social History Narrative    Family History  Problem Relation Age of Onset  . Cancer Neg Hx   . Diabetes Neg Hx   . Heart disease Neg Hx     The following portions of the patient's history were reviewed and updated as appropriate: allergies, current medications, past family history, past medical history, past social history, past surgical history and problem list.  Review of Systems Review of Systems - General ROS: negative for - chills, fatigue, fever, hot flashes, malaise or night sweats Hematological and Lymphatic ROS: negative for - bleeding problems or swollen lymph nodes Gastrointestinal ROS: negative for - , blood in stools, change in bowel habits and nausea/vomiting. POSITIVE-right lower quadrant abdominal pain Musculoskeletal ROS: negative for -  joint pain, muscle pain or muscular weakness Genito-Urinary ROS: negative for - change in menstrual cycle, dysmenorrhea, dyspareunia, dysuria, genital discharge, genital ulcers, hematuria, incontinence, irregular/heavy menses, nocturia  Objective:   BP 114/72 mmHg  Pulse 86  Ht 5' 8.5" (1.74 m)  Wt 162 lb 8 oz (73.71 kg)  BMI 24.35 kg/m2  LMP 07/17/2015 (Exact Date) CONSTITUTIONAL: Well-developed, well-nourished female in no acute distress.  HENT:  Normocephalic, atraumatic.  NECK: Normal range of motion, supple, no masses.  Normal thyroid.  SKIN: Skin is warm  and dry. No rash noted. Not diaphoretic. No erythema. No pallor. NEUROLGIC: Alert and oriented to person, place, and time. PSYCHIATRIC: Normal mood and affect. Normal behavior. Normal judgment and thought content. CARDIOVASCULAR:Not Examined RESPIRATORY: Not Examined BREASTS: Not Examined BACK: Mild right CVA tenderness; no left CVA tenderness ABDOMEN: Soft, non distended; Non tender.  No Organomegaly. No guarding. PELVIC:  External Genitalia: Normal  BUS: Normal  Vagina: Normal  Cervix: Normal; no cervical motion tenderness  Uterus: Normal size, shape,consistency, mobile; midplane, 1/4 tender;  Adnexa: Normal; nonpalpable and nontender  RV: Normal external exam  Bladder: Nontender MUSCULOSKELETAL: Normal range of motion. No tenderness.  No cyanosis, clubbing, or edema.     Assessment:   1. Pelvic pain in female - Urine culture - POCT urinalysis dipstick  2. Cyst of right ovary. Nonpalpable and nontender  3. Right flank pain  4. Kidney stone on right side; 6.9 millimeter rt renal pelvis    Plan:   1. Maintain by mouth fluid hydration 2. Continue hydrocodone therapy for symptomatic pain 3. CT stone study ordered 4. Follow-up with urology as scheduled 5. No significant pelvic pathology is identified on clinical exam. Reassurance regarding pelvic pathology is given.  A total of 30 minutes were spent face-to-face with the patient during the encounter with greater than 50% dealing with counseling and coordination of care.  Herold Harms, MD  Note: This dictation was prepared with Dragon dictation along with smaller phrase technology. Any transcriptional errors that result from this process are unintentional.

## 2015-07-22 NOTE — Patient Instructions (Signed)
1. CT renal stone study ordered 2. Continue hydrocodone for pelvic pain and flank pain 3. Follow-up with urology on Friday

## 2015-07-24 ENCOUNTER — Encounter: Payer: Self-pay | Admitting: Urology

## 2015-07-24 ENCOUNTER — Ambulatory Visit (INDEPENDENT_AMBULATORY_CARE_PROVIDER_SITE_OTHER): Payer: BLUE CROSS/BLUE SHIELD | Admitting: Urology

## 2015-07-24 VITALS — BP 129/81 | HR 83 | Ht 68.5 in | Wt 162.4 lb

## 2015-07-24 DIAGNOSIS — N200 Calculus of kidney: Secondary | ICD-10-CM

## 2015-07-24 DIAGNOSIS — F331 Major depressive disorder, recurrent, moderate: Secondary | ICD-10-CM | POA: Insufficient documentation

## 2015-07-24 DIAGNOSIS — N201 Calculus of ureter: Secondary | ICD-10-CM | POA: Diagnosis not present

## 2015-07-24 DIAGNOSIS — R109 Unspecified abdominal pain: Secondary | ICD-10-CM | POA: Diagnosis not present

## 2015-07-24 LAB — URINE CULTURE: ORGANISM ID, BACTERIA: NO GROWTH

## 2015-07-24 MED ORDER — OXYCODONE-ACETAMINOPHEN 5-325 MG PO TABS: 1.0000 | ORAL_TABLET | Freq: Four times a day (QID) | ORAL | Status: DC | PRN

## 2015-07-24 MED ORDER — TAMSULOSIN HCL 0.4 MG PO CAPS: 0.4000 mg | ORAL_CAPSULE | Freq: Every day | ORAL | Status: DC

## 2015-07-24 NOTE — Patient Instructions (Signed)
Dietary Guidelines to Help Prevent Kidney Stones Your risk of kidney stones can be decreased by adjusting the foods you eat. The most important thing you can do is drink enough fluid. You should drink enough fluid to keep your urine clear or pale yellow. The following guidelines provide specific information for the type of kidney stone you have had. GUIDELINES ACCORDING TO TYPE OF KIDNEY STONE Calcium Oxalate Kidney Stones  Reduce the amount of salt you eat. Foods that have a lot of salt cause your body to release excess calcium into your urine. The excess calcium can combine with a substance called oxalate to form kidney stones.  Reduce the amount of animal protein you eat if the amount you eat is excessive. Animal protein causes your body to release excess calcium into your urine. Ask your dietitian how much protein from animal sources you should be eating.  Avoid foods that are high in oxalates. If you take vitamins, they should have less than 500 mg of vitamin C. Your body turns vitamin C into oxalates. You do not need to avoid fruits and vegetables high in vitamin C. Calcium Phosphate Kidney Stones  Reduce the amount of salt you eat to help prevent the release of excess calcium into your urine.  Reduce the amount of animal protein you eat if the amount you eat is excessive. Animal protein causes your body to release excess calcium into your urine. Ask your dietitian how much protein from animal sources you should be eating.  Get enough calcium from food or take a calcium supplement (ask your dietitian for recommendations). Food sources of calcium that do not increase your risk of kidney stones include:  Broccoli.  Dairy products, such as cheese and yogurt.  Pudding. Uric Acid Kidney Stones  Do not have more than 6 oz of animal protein per day. FOOD SOURCES Animal Protein Sources  Meat (all types).  Poultry.  Eggs.  Fish, seafood. Foods High in Salt  Salt seasonings.  Soy  sauce.  Teriyaki sauce.  Cured and processed meats.  Salted crackers and snack foods.  Fast food.  Canned soups and most canned foods. Foods High in Oxalates  Grains:  Amaranth.  Barley.  Grits.  Wheat germ.  Bran.  Buckwheat flour.  All bran cereals.  Pretzels.  Whole wheat bread.  Vegetables:  Beans (wax).  Beets and beet greens.  Collard greens.  Eggplant.  Escarole.  Leeks.  Okra.  Parsley.  Rutabagas.  Spinach.  Swiss chard.  Tomato paste.  Fried potatoes.  Sweet potatoes.  Fruits:  Red currants.  Figs.  Kiwi.  Rhubarb.  Meat and Other Protein Sources:  Beans (dried).  Soy burgers and other soybean products.  Miso.  Nuts (peanuts, almonds, pecans, cashews, hazelnuts).  Nut butters.  Sesame seeds and tahini (paste made of sesame seeds).  Poppy seeds.  Beverages:  Chocolate drink mixes.  Soy milk.  Instant iced tea.  Juices made from high-oxalate fruits or vegetables.  Other:  Carob.  Chocolate.  Fruitcake.  Marmalades.   This information is not intended to replace advice given to you by your health care provider. Make sure you discuss any questions you have with your health care provider.   Document Released: 06/04/2010 Document Revised: 02/12/2013 Document Reviewed: 01/04/2013 Elsevier Interactive Patient Education 2016 [employer] Inc[employer]  

## 2015-07-24 NOTE — Progress Notes (Signed)
07/24/2015 2:57 PM   Susan Garner May 01, 1970 [id number]  Referring provider: Ellyn Hack, MD [address] [place], [place] [address]  Chief Complaint  Patient presents with  . New Patient (Initial Visit)    renal stones    HPI: 45 year old female who presents today to establish care after developing severe onset of acute right pelvic/ flank pain.  She was seen and evaluated in the emergency room on 07/21/2015 for this. As part of her workup, she underwent KUB which demonstrated a 5 mm proximal right ureteral calculus along with a renal ultrasound which showed some mild renal pelvic fullness and some shadowing within the kidney possibly representing another nonobstructing stone. Her left kidney was normal.  Creatinine 0.84, recent BC 8.0, UA negative other than for blood/ protein.    Today, she continues to have some mild right lower quadrant pain which comes and goes but is not as severe. No nausea or vomiting and able to tolerate food and beverages.  No dysuria or gross hematuria or any other urinary symptoms.  She was given 10 day supply of Flomax, pain medications, and urinary strainer which she has been using. She has not seen the stone passed to date.  Prior to this episode, she has no personal history of kidney stones.  As admitted to eating a high salt diet, not much water, and drinks*Soto is on a regular basis. She is interested in learning how to prevent kidney stones in the future.  She was scheduled to undergo CT stone protocol by Dr. Greggory Keen next week.   PMH: Past Medical History  Diagnosis Date  . Anxiety   . Depression   . Ovarian cyst   . Kidney stones     Surgical History: Past Surgical History  Procedure Laterality Date  . Tubal ligation    . Tonsillectomy    . Femur fracture surgery Right 1980's    Home Medications:    Medication List       This list is accurate as of: 07/24/15 11:59 PM.  Always use your most recent  med list.               ibuprofen 800 MG tablet  Commonly known as:  ADVIL,MOTRIN  Take 1 tablet (800 mg total) by mouth every 8 (eight) hours as needed.     oxyCODONE-acetaminophen 5-325 MG tablet  Commonly known as:  PERCOCET  Take 1-2 tablets by mouth every 6 (six) hours as needed.     tamsulosin 0.4 MG Caps capsule  Commonly known as:  FLOMAX  Take 1 capsule (0.4 mg total) by mouth daily.        Allergies: No Known Allergies  Family History: Family History  Problem Relation Age of Onset  . Cancer Neg Hx   . Diabetes Neg Hx   . Heart disease Neg Hx     Social History:  reports that she has never smoked. She has never used smokeless tobacco. She reports that she does not drink alcohol or use illicit drugs.  ROS: UROLOGY Frequent Urination?: No Hard to postpone urination?: No Burning/pain with urination?: No Get up at night to urinate?: No Leakage of urine?: No Urine stream starts and stops?: No Trouble starting stream?: No Do you have to strain to urinate?: No Blood in urine?: No Urinary tract infection?: No Sexually transmitted disease?: No Injury to kidneys or bladder?: No Painful intercourse?: No Weak stream?: No Currently pregnant?: No Vaginal bleeding?: No Last menstrual period?:  No  Gastrointestinal Nausea?: No Vomiting?: No Indigestion/heartburn?: No Diarrhea?: No Constipation?: No  Constitutional Fever: No Night sweats?: No Weight loss?: No Fatigue?: No  Skin Skin rash/lesions?: No Itching?: No  Eyes Blurred vision?: No Double vision?: No  Ears/Nose/Throat Sore throat?: No Sinus problems?: No  Hematologic/Lymphatic Swollen glands?: No Easy bruising?: No  Cardiovascular Leg swelling?: No Chest pain?: No  Respiratory Cough?: No Shortness of breath?: No  Endocrine Excessive thirst?: No  Musculoskeletal Back pain?: Yes Joint pain?: No  Neurological Headaches?: No Dizziness?: No  Psychologic Depression?:  No Anxiety?: No  Physical Exam: BP 129/81 mmHg  Pulse 83  Ht 5' 8.5" (1.74 m)  Wt 162 lb 6.4 oz (73.664 kg)  BMI 24.33 kg/m2  LMP 07/17/2015 (Exact Date)  Constitutional:  Alert and oriented, No acute distress. HEENT:  AT, moist mucus membranes.  Trachea midline, no masses. Cardiovascular: No clubbing, cyanosis, or edema. Respiratory: Normal respiratory effort, no increased work of breathing. GI: Abdomen is soft, nontender, nondistended, no abdominal masses GU: No CVA tenderness.  Skin: No rashes, bruises or suspicious lesions. Lymph: No cervical or adenopathy. Neurologic: Grossly intact, no focal deficits, moving all 4 extremities. Psychiatric: Normal mood and affect.  Laboratory Data: Lab Results  Component Value Date   WBC 8.0 07/21/2015   HGB 13.1 07/21/2015   HCT 37.6 07/21/2015   MCV 87.2 07/21/2015   PLT 254 07/21/2015    Lab Results  Component Value Date   CREATININE 0.84 07/21/2015    Lab Results  Component Value Date   HGBA1C 5.3 11/25/2014    Urinalysis Results for orders placed or performed in visit on 07/22/15  Urine culture  Result Value Ref Range   Urine Culture, Routine Final report    Urine Culture result 1 No growth   POCT urinalysis dipstick  Result Value Ref Range   Color, UA yellow    Clarity, UA clear    Glucose, UA neg    Bilirubin, UA neg    Ketones, UA neg    Spec Grav, UA >=1.030    Blood large    pH, UA 5.0    Protein, UA trace    Urobilinogen, UA 0.2    Nitrite, UA neg    Leukocytes, UA Negative Negative     Pertinent Imaging:    Study Result     CLINICAL DATA: Acute onset of right lower quadrant abdominal pain and right flank pain earlier today.  EXAM: ABDOMEN - 1 VIEW  COMPARISON: CT abdomen pelvis 07/10/2006 and earlier.  FINDINGS: Bowel gas pattern unremarkable without evidence of obstruction or significant ileus. Moderate stool burden in the colon. Approximate 5 mm calcification projected over the  right transverse process of L4, likely within the proximal right ureter. No other visible opaque urinary tract calculi. Mild degenerative changes at the lumbosacral junction.  IMPRESSION: Approximate 5 mm proximal right ureteral calculus.   Electronically Signed  By: Hulan Saashomas Lawrence M.D.  On: 07/21/2015 11:28      Study Result     CLINICAL DATA: Right flank pain. Rule out hydronephrosis.  EXAM: RENAL / URINARY TRACT ULTRASOUND COMPLETE  COMPARISON: CT abdomen pelvis 07/10/2006  FINDINGS: Right Kidney:  Length: 10.5 cm. Mild fullness left renal pelvis versus extra renal pelvis. No definite hydronephrosis. 6.9 mm shadowing stone midpole which is not causing obstruction.  Left Kidney:  Length: 11.3 cm. Echogenicity within normal limits. No mass or hydronephrosis visualized.  Bladder:  Normal bladder. Bilateral ureteral jets identified.  IMPRESSION: 6.9 mm nonobstructing  stone right midpole. Possible mild fullness right renal pelvis without definite hydronephrosis  Bilateral ureteral jets identified.   Electronically Signed  By: Marlan Palau M.D.  On: 07/21/2015 11:03      Assessment & Plan:    1. Kidney stones Reviewed imaging with patient.   Given that we can see the stones quite well on ultrasound and KUB, no need for CT stone protocol at this time In the future if deemed necessary  We discussed general stone prevention techniques including drinking plenty water with goal of producing 2.5 L urine daily, increased citric acid intake, avoidance of high oxalate containing foods, and decreased salt intake.  Information about dietary recommendations given today.   - DG Abd 1 View; Future  2. Right flank pain  additional pain medication prescribed today  3. Right ureteral stone Discussed options for treating her 5 mm proximal ureteral stone.   Options include medical expulsive therapy, ureteroscopy, and ESWL. Risk and  benefits of each modality were discussed. Given the size and location of the stone, it has a good chance of passing on its own. Currently, she is an excellent candidate for this without signs or symptoms of infection. We reviewed warning symptoms in detail. She would like to continue to try to pass her stone without surgical intervention at this time. I would like for her to follow up in 2 weeks with a KUB. She will present to the emergency room or call our office in the interim should she develop any fevers, chills, or urinary symptoms. Continue to strain her urine and take Flomax.    Return in about 2 weeks (around 08/07/2015) for KUB.  Vanna Scotland, MD  [facility] [address] [place], [place] [address] [phone]

## 2015-07-27 ENCOUNTER — Ambulatory Visit: Payer: BLUE CROSS/BLUE SHIELD

## 2015-08-10 ENCOUNTER — Other Ambulatory Visit: Payer: Self-pay | Admitting: Urology

## 2015-08-12 ENCOUNTER — Ambulatory Visit: Payer: Self-pay | Admitting: Urology

## 2015-08-14 ENCOUNTER — Telehealth: Payer: Self-pay | Admitting: Urology

## 2015-08-14 NOTE — Telephone Encounter (Signed)
Mrs. Susan Garner was supposed to follow-up in 2 weeks with KUB but I received random stone analysis in my in basket today with primary calcium oxalate stone.  Called patient, unable to reach.  Can you please f/u with patient and see if she passed her stone, why I have a f/u stone analysis, and let her know the results please.     Vanna ScotlandAshley Davius Goudeau, MD

## 2015-08-18 NOTE — Telephone Encounter (Signed)
LMOM

## 2015-08-18 NOTE — Telephone Encounter (Signed)
Spoke with pt in reference to stone analysis. Pt stated that she did pass the stone and therefore she did not get KUB or keep f/u appt. Made pt aware of stone analysis results.

## 2015-10-02 ENCOUNTER — Encounter: Payer: Self-pay | Admitting: *Deleted

## 2015-11-09 ENCOUNTER — Telehealth: Payer: Self-pay | Admitting: Family Medicine

## 2015-11-10 NOTE — Telephone Encounter (Signed)
Returned patient's call and informed her that we are showing no record of collecting a $50.00 copay for service dates 04/16/15 or 05/12/15.  I also informed the patient that I looked through receipts for those dates of service and didn't find any receipts on her either.  Patient asked if she had a balance for Dr. Sherryll BurgerShah and I informed her that her $50.00 balance was for seeing Dr. Apolinar JunesBrandon.  Pt stated that she understood and that was all that she needed to know.

## 2015-11-23 ENCOUNTER — Ambulatory Visit: Payer: Self-pay | Admitting: Family Medicine

## 2015-11-24 ENCOUNTER — Ambulatory Visit: Payer: Self-pay | Admitting: Family Medicine

## 2015-12-08 ENCOUNTER — Encounter: Payer: Self-pay | Admitting: Family Medicine

## 2015-12-08 ENCOUNTER — Ambulatory Visit (INDEPENDENT_AMBULATORY_CARE_PROVIDER_SITE_OTHER): Payer: BLUE CROSS/BLUE SHIELD | Admitting: Family Medicine

## 2015-12-08 VITALS — BP 110/70 | HR 73 | Temp 98.8°F | Resp 16 | Ht 69.0 in | Wt 158.0 lb

## 2015-12-08 DIAGNOSIS — F332 Major depressive disorder, recurrent severe without psychotic features: Secondary | ICD-10-CM

## 2015-12-08 DIAGNOSIS — F419 Anxiety disorder, unspecified: Secondary | ICD-10-CM | POA: Diagnosis not present

## 2015-12-08 MED ORDER — VENLAFAXINE HCL ER 75 MG PO CP24: 75.0000 mg | ORAL_CAPSULE | Freq: Every day | ORAL | 0 refills | Status: DC

## 2015-12-08 MED ORDER — ALPRAZOLAM 0.5 MG PO TABS: 0.5000 mg | ORAL_TABLET | Freq: Three times a day (TID) | ORAL | 0 refills | Status: DC | PRN

## 2015-12-08 NOTE — Progress Notes (Signed)
Name: Susan Garner   MRN: [id number]    DOB: 24-Aug-1970   Date:12/08/2015       Progress Note  Subjective  Chief Complaint  Chief Complaint  Patient presents with  . Depression    follow up, medication made her hallucinate    Depression       The patient presents with depression.  This is a chronic problem.  The onset quality is gradual.   The problem has been gradually worsening since onset.  Associated symptoms include fatigue, hopelessness, body aches, myalgias and sad.  Treatments tried: tried multiple anti-depressants in the past, discontinued the most recent one for hallucinations.  Compliance with treatment is good.  Risk factors include history of mental illness.   Past medical history includes anxiety and depression.   Anxiety  Presents for follow-up visit. Symptoms include depressed mood, excessive worry and muscle tension. Patient reports no nervous/anxious behavior or panic. The severity of symptoms is moderate and causing significant distress.   Her past medical history is significant for depression.    Past Medical History:  Diagnosis Date  . Anxiety   . Depression   . Kidney stones   . Ovarian cyst     Past Surgical History:  Procedure Laterality Date  . FEMUR FRACTURE SURGERY Right 1980's  . TONSILLECTOMY    . TUBAL LIGATION      Family History  Problem Relation Age of Onset  . Cancer Neg Hx   . Diabetes Neg Hx   . Heart disease Neg Hx     Social History   Social History  . Marital status: Single    Spouse name: N/A  . Number of children: N/A  . Years of education: N/A   Occupational History  . Not on file.   Social History Main Topics  . Smoking status: Never Smoker  . Smokeless tobacco: Never Used  . Alcohol use No  . Drug use: No  . Sexual activity: No   Other Topics Concern  . Not on file   Social History Narrative  . No narrative on file     Current Outpatient Prescriptions:  .  ibuprofen (ADVIL,MOTRIN) 800 MG tablet,  Take 1 tablet (800 mg total) by mouth every 8 (eight) hours as needed., Disp: 30 tablet, Rfl: 0  No Known Allergies   Review of Systems  Constitutional: Positive for fatigue.  Musculoskeletal: Positive for myalgias.  Psychiatric/Behavioral: Positive for depression. The patient is not nervous/anxious.     Objective  Vitals:   12/08/15 1415  BP: 110/70  Pulse: 73  Resp: 16  Temp: 98.8 F (37.1 C)  TempSrc: Oral  SpO2: 98%  Weight: 158 lb (71.7 kg)  Height: 5\' 9"  (1.753 m)    Physical Exam  Constitutional: She is well-developed, well-nourished, and in no distress.  HENT:  Right Ear: Tympanic membrane and ear canal normal.  Left Ear: Tympanic membrane and ear canal normal.  Cardiovascular: Normal rate, regular rhythm, S1 normal, S2 normal and normal heart sounds.  Exam reveals no gallop.   No murmur heard. Pulmonary/Chest: Effort normal. No respiratory distress. She has no decreased breath sounds. She has no wheezes. She has no rhonchi.  Psychiatric: Memory and judgment normal. She exhibits a depressed mood. She expresses no homicidal and no suicidal ideation. She expresses no suicidal plans and no homicidal plans.  Pt. In tears, depressed, feels hopeless, no suicidal or homicidal ideation  Nursing note and vitals reviewed.    Assessment & Plan  1.  Severe episode of recurrent major depressive disorder, without psychotic features (HCC) Worsening, we'll restart back on Venlafaxine to help with mood and somatic symptoms. Referral to psychiatry and psychology. - venlafaxine XR (EFFEXOR-XR) 75 MG 24 hr capsule; Take 1 capsule (75 mg total) by mouth daily with breakfast.  Dispense: 90 capsule; Refill: 0 - Ambulatory referral to Psychiatry  2. Anxiety Worsening anxiety, increase alprazolam to 0.5 mg 3 times daily when necessary, reassess in 4 weeks. - ALPRAZolam (XANAX) 0.5 MG tablet; Take 1 tablet (0.5 mg total) by mouth 3 (three) times daily as needed for anxiety.  Dispense:  90 tablet; Refill: 0     Ival Pacer Asad A. Faylene KurtzShah [facility] 12/08/2015 2:21 PM

## 2015-12-14 ENCOUNTER — Other Ambulatory Visit: Payer: Self-pay | Admitting: Family Medicine

## 2015-12-14 DIAGNOSIS — F331 Major depressive disorder, recurrent, moderate: Secondary | ICD-10-CM

## 2015-12-15 ENCOUNTER — Telehealth: Payer: Self-pay | Admitting: Family Medicine

## 2015-12-15 NOTE — Telephone Encounter (Signed)
Pt [facility] to see Dr Daphine DeutscherAnne Taylor. Phone # is [phone], Fax [phone]

## 2015-12-16 NOTE — Telephone Encounter (Signed)
Referral has been entered and processed

## 2016-01-06 ENCOUNTER — Ambulatory Visit: Payer: Self-pay | Admitting: Family Medicine

## 2016-01-20 ENCOUNTER — Encounter (HOSPITAL_COMMUNITY): Payer: Self-pay

## 2016-01-29 NOTE — Progress Notes (Signed)
noted 

## 2016-02-10 ENCOUNTER — Ambulatory Visit (INDEPENDENT_AMBULATORY_CARE_PROVIDER_SITE_OTHER): Payer: Self-pay | Admitting: Psychiatry

## 2016-02-10 ENCOUNTER — Encounter: Payer: Self-pay | Admitting: Psychiatry

## 2016-02-10 VITALS — BP 107/69 | HR 65 | Temp 98.6°F | Wt 154.6 lb

## 2016-02-10 DIAGNOSIS — F331 Major depressive disorder, recurrent, moderate: Secondary | ICD-10-CM

## 2016-02-10 IMAGING — MR MR LUMBAR SPINE W/O CM
4 of 5 series · 24 of 48 positions shown · non-contrast
Comparison: None.

CLINICAL DATA: Lumbar disc displacement. Right leg numbness and
burning.

EXAM:
MRI LUMBAR SPINE WITHOUT CONTRAST
TECHNIQUE: Multiplanar, multisequence MR imaging of the lumbar spine was
performed. No intravenous contrast was administered.

[Series 2: T2 · sagittal · 4.0mm · 0.81mm/px · 6 of 15 slices shown (1 of 2)]
[im 1/15]
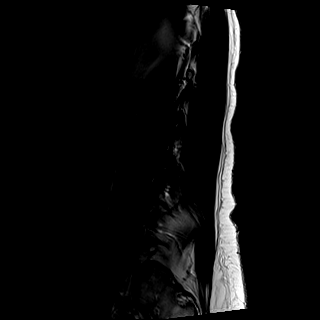
[im 3/15]
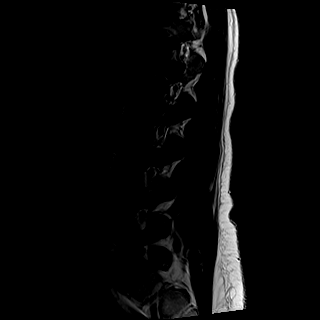
[im 6/15]
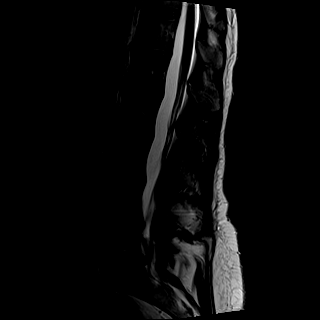
[im 9/15]
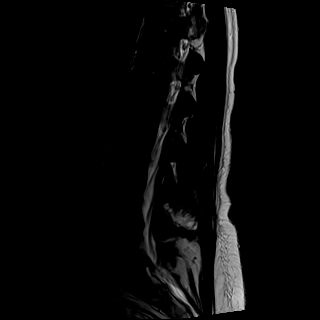
[im 12/15]
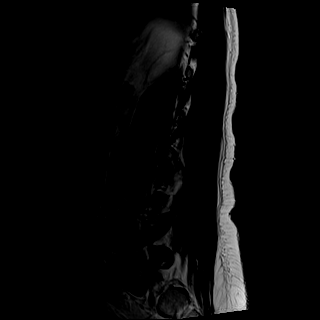
[im 15/15]
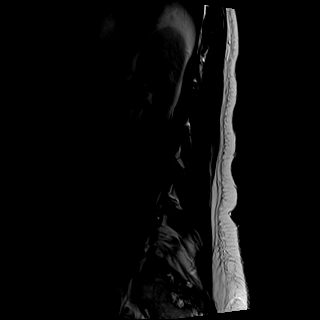

[Series 3: T1 · sagittal · 4.0mm · 0.41mm/px · 6 of 15 slices shown (1 of 2)]
[im 1/15]
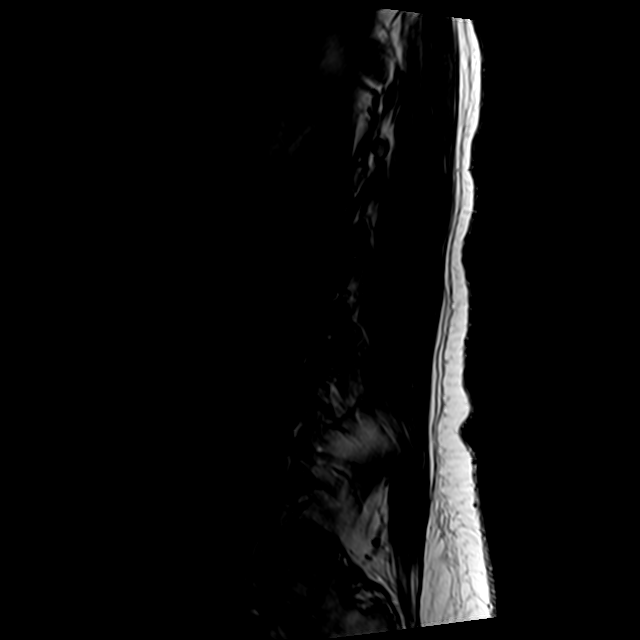
[im 3/15]
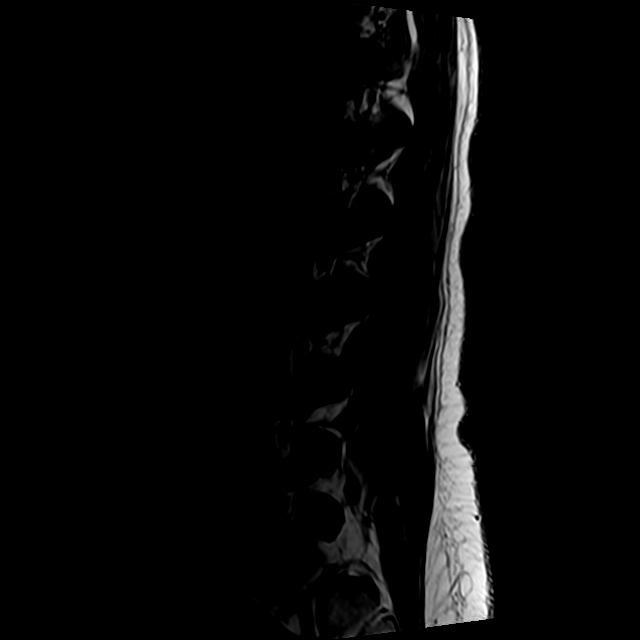
[im 6/15]
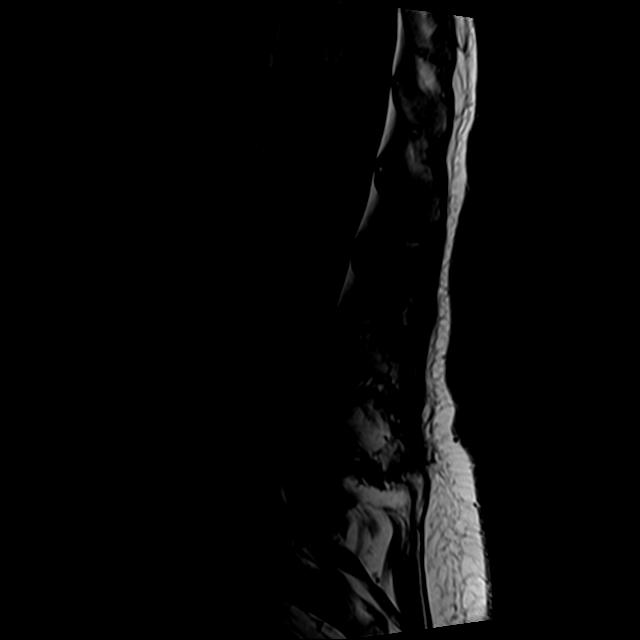
[im 9/15]
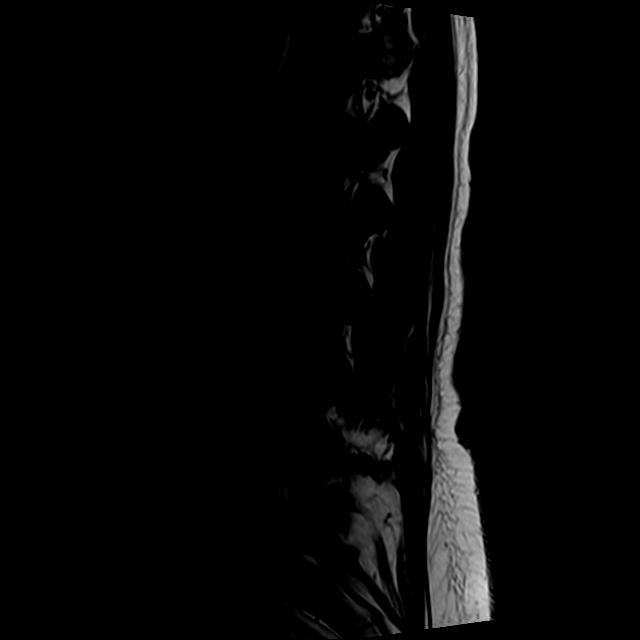
[im 12/15]
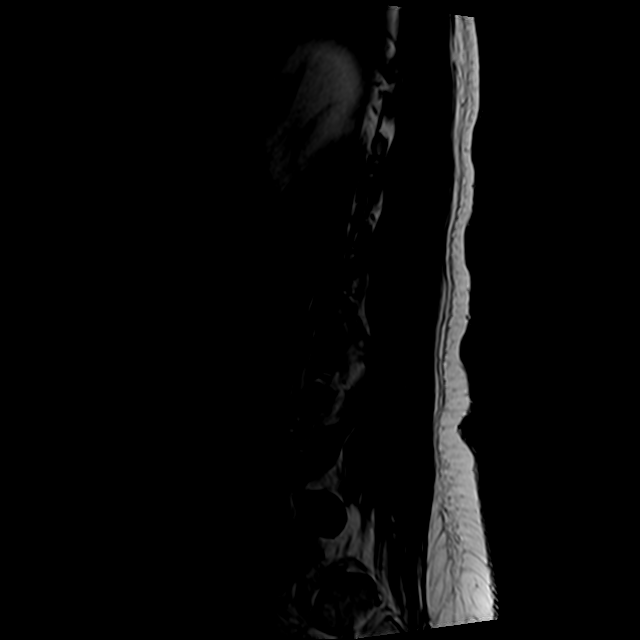
[im 15/15]
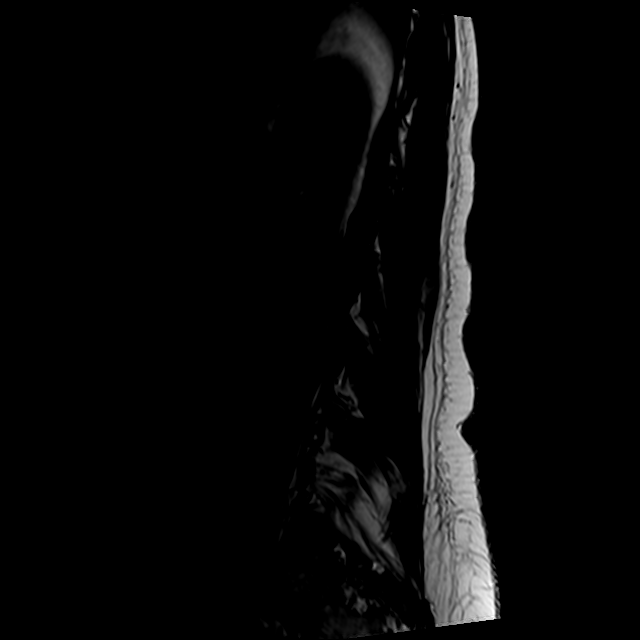

[Series 5: T2 · axial · 4.0mm · 0.78mm/px · z∈[-105,+118]mm · 9 of 39 slices shown (2 of 2)]
[im 1/39]
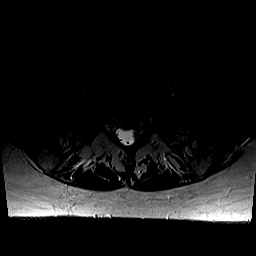
[im 6/39]
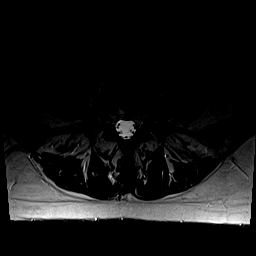
[im 11/39]
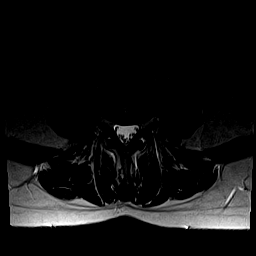
[im 17/39]
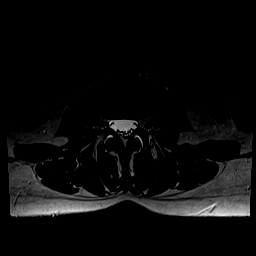
[im 20/39]
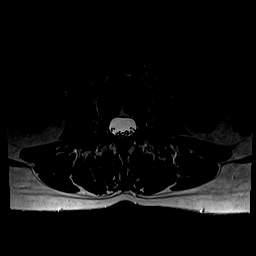
[im 22/39]
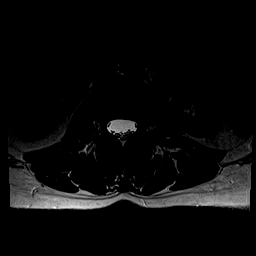
[im 28/39]
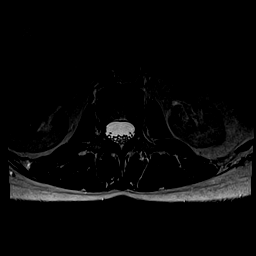
[im 33/39]
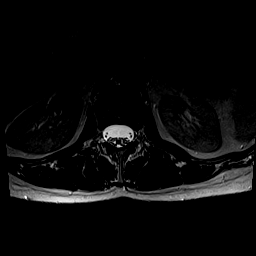
[im 39/39]
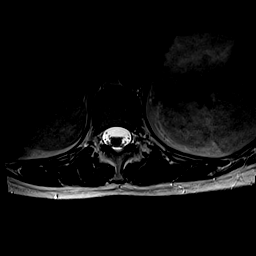

[Series 6: T1 · axial · 4.0mm · 0.31mm/px · z∈[-81,+88]mm · 3 of 39 slices shown (2 of 2)]
[im 6/39]
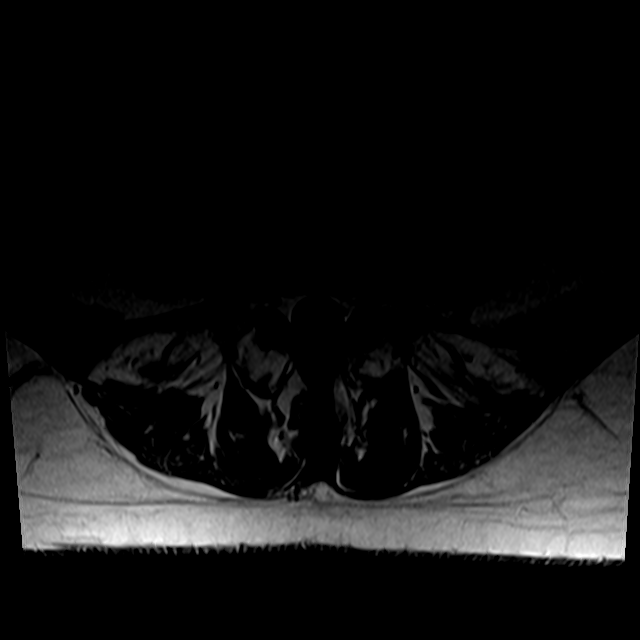
[im 20/39]
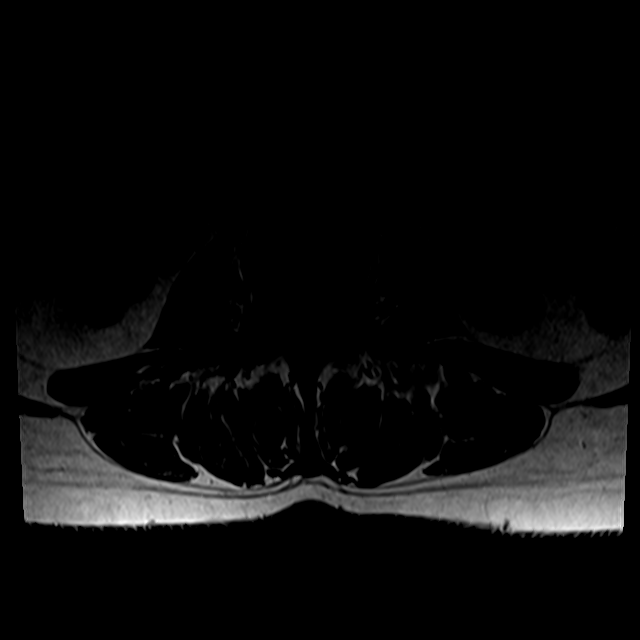
[im 33/39]
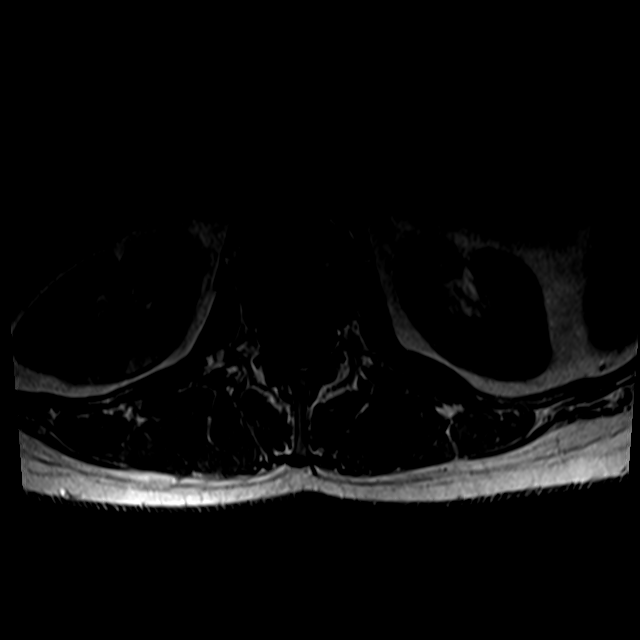

[24 of 48 positions shown; findings below may reference images not displayed]

FINDINGS: The vertebral bodies of the lumbar spine are normal in size. The
vertebral bodies of the lumbar spine are normal in alignment. There
is normal bone marrow signal demonstrated throughout the vertebra.
There is degenerative disc disease with disc height loss at L5-S1.

The spinal cord is normal in signal and contour. The cord terminates
normally at L1 . The nerve roots of the cauda equina and the filum
terminale are normal.

The visualized portions of the SI joints are unremarkable.

The imaged intra-abdominal contents are unremarkable.

T12-L1: No significant disc bulge. No evidence of neural foraminal
stenosis. No central canal stenosis.

L1-L2: No significant disc bulge. No evidence of neural foraminal
stenosis. No central canal stenosis.

L2-L3: No significant disc bulge. No evidence of neural foraminal
stenosis. No central canal stenosis.

L3-L4: No significant disc bulge. No evidence of neural foraminal
stenosis. No central canal stenosis.

L4-L5: Mild broad-based disc bulge. No evidence of neural foraminal
stenosis. No central canal stenosis.

L5-S1: Mild broad-based disc bulge with a shallow left paracentral
disc protrusion which has significantly desiccated compared with
04/09/2013. No evidence of neural foraminal stenosis. No central
canal stenosis.
IMPRESSION: 1. At L5-S1 there is a mild broad-based disc bulge with a shallow
left paracentral disc protrusion which has significantly desiccated
compared with 04/09/2013. Overall, significant interval improvement
compared with 04/09/2013.
2. No findings to explain the patient's right-sided radiculopathy.

## 2016-02-10 MED ORDER — ARIPIPRAZOLE 2 MG PO TABS: 2.0000 mg | ORAL_TABLET | Freq: Every day | ORAL | 0 refills | Status: DC

## 2016-02-10 MED ORDER — VORTIOXETINE HBR 10 MG PO TABS: 10.0000 mg | ORAL_TABLET | ORAL | 0 refills | Status: DC
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
## 2016-02-25 ENCOUNTER — Ambulatory Visit: Payer: Self-pay | Admitting: Psychiatry

## 2016-03-28 ENCOUNTER — Ambulatory Visit (INDEPENDENT_AMBULATORY_CARE_PROVIDER_SITE_OTHER): Payer: BLUE CROSS/BLUE SHIELD | Admitting: Psychiatry

## 2016-03-28 ENCOUNTER — Telehealth: Payer: Self-pay

## 2016-03-28 ENCOUNTER — Encounter: Payer: Self-pay | Admitting: Psychiatry

## 2016-03-28 VITALS — BP 104/70 | HR 62 | Temp 98.3°F | Wt 162.4 lb

## 2016-03-28 DIAGNOSIS — F331 Major depressive disorder, recurrent, moderate: Secondary | ICD-10-CM | POA: Diagnosis not present

## 2016-03-28 MED ORDER — ARIPIPRAZOLE 5 MG PO TABS: 5.0000 mg | ORAL_TABLET | Freq: Every day | ORAL | 1 refills | Status: DC

## 2016-03-28 MED ORDER — VORTIOXETINE HBR 20 MG PO TABS: 20.0000 mg | ORAL_TABLET | Freq: Every day | ORAL | 1 refills | Status: DC

## 2016-03-28 NOTE — Telephone Encounter (Signed)
pt called states that she can not afford the trintellix.  is there anything cheaper. Pt was offered discount card but she declined.

## 2016-03-28 NOTE — Telephone Encounter (Signed)
Ask her to make appt to discuss meds
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
## 2016-03-30 NOTE — Telephone Encounter (Signed)
pt was called.  pt does not have an appt until next month so I will give her samples until she can come in.  trintellix 10mg  pt was told to take 2.  to make 20mg .  lot # [id number] ext sep 2020

## 2016-03-30 NOTE — Telephone Encounter (Signed)
noted 

## 2016-04-25 ENCOUNTER — Encounter: Payer: Self-pay | Admitting: Psychiatry

## 2016-04-25 ENCOUNTER — Ambulatory Visit (INDEPENDENT_AMBULATORY_CARE_PROVIDER_SITE_OTHER): Payer: BLUE CROSS/BLUE SHIELD | Admitting: Psychiatry

## 2016-04-25 VITALS — BP 101/62 | HR 63 | Temp 98.4°F | Wt 163.4 lb

## 2016-04-25 DIAGNOSIS — F331 Major depressive disorder, recurrent, moderate: Secondary | ICD-10-CM

## 2016-04-25 MED ORDER — ARIPIPRAZOLE 5 MG PO TABS: 5.0000 mg | ORAL_TABLET | Freq: Every day | ORAL | 1 refills | Status: DC

## 2016-04-25 MED ORDER — DESVENLAFAXINE SUCCINATE ER 50 MG PO TB24: 50.0000 mg | ORAL_TABLET | Freq: Every day | ORAL | 1 refills | Status: DC

## 2016-04-25 NOTE — Progress Notes (Signed)
Psychiatric MD Progress Note   Patient Identification: Susan Garner MRN:  [id number] Date of Evaluation:  04/25/2016 Referral Source: previous pt  Chief Complaint:   Chief Complaint    Follow-up; Medication Refill     Visit Diagnosis:    ICD-9-CM ICD-10-CM   1. Major depressive disorder, recurrent episode, moderate (HCC) 296.32 F33.1     History of Present Illness:   Patient is a 46 year old female who presented for Follow-up. She Ported that she has been feeling very tired and depressed. She reported that she does not have any motivation to wake up in the morning and to get dressed to go to her work. She reported that she has been taking her medications and feels that her depression is not improving. She currently lives with her 29 year old daughter. She currently denied having any suicidal ideations or plans. She continues to have lack of energy. She denied having any perceptual disturbances. We discussed about her medications. She just filled in having her medications adjusted. She reported that she takes Abilify in the morning. She has been sleeping well at night. She reported that she has been depressed since she had her relationship changed in September.  She does not have any outdoor physical activities at this time. She just comes back from work and stays at home with her daughter.  She is receptive to medication changes at this time.   Associated Signs/Symptoms: Depression Symptoms:  depressed mood, anhedonia, fatigue, hopelessness, decreased appetite, (Hypo) Manic Symptoms:  Distractibility, Irritable Mood, Labiality of Mood, Anxiety Symptoms:  Excessive Worry, Psychotic Symptoms:  none PTSD Symptoms: Negative NA  Past Psychiatric History:  H/o Depression. She does not have any history of psychiatric hospitalization. She denied having any history of suicide attempts.   Previous Psychotropic Medications:   wellbutrin celexa Xanax Elavil prozac Lexapro Cymbalta effexor  Substance Abuse History in the last 12 months:  No.  Consequences of Substance Abuse: Negative NA  Past Medical History:  Past Medical History:  Diagnosis Date  . Anxiety   . Depression   . Headache   . Kidney stones   . Ovarian cyst     Past Surgical History:  Procedure Laterality Date  . FEMUR FRACTURE SURGERY Right 1980's  . TONSILLECTOMY    . TUBAL LIGATION      Family Psychiatric History:   Mother- ? Bipolar  Family History:  Family History  Problem Relation Age of Onset  . Anxiety disorder Mother   . Cancer Neg Hx   . Diabetes Neg Hx   . Heart disease Neg Hx     Social History:   Social History   Social History  . Marital status: Single    Spouse name: N/A  . Number of children: N/A  . Years of education: N/A   Social History Main Topics  . Smoking status: Never Smoker  . Smokeless tobacco: Never Used  . Alcohol use No  . Drug use: No  . Sexual activity: Not Currently    Birth control/ protection: Surgical   Other Topics Concern  . None   Social History Narrative  . None    Additional Social History:  Lives with 6 yo daughter who has learning disability.    Allergies:  No Known Allergies  Metabolic Disorder Labs: Lab Results  Component Value Date   HGBA1C 5.3 11/25/2014   No results found for: PROLACTIN No results found for: CHOL, TRIG, HDL, CHOLHDL, VLDL, LDLCALC   Current Medications: Current Outpatient Prescriptions  Medication Sig  Dispense Refill  . ARIPiprazole (ABILIFY) 5 MG tablet Take 1 tablet (5 mg total) by mouth daily. 30 tablet 1  . ibuprofen (ADVIL,MOTRIN) 800 MG tablet Take 1 tablet (800 mg total) by mouth every 8 (eight) hours as needed. 30 tablet 0  . vortioxetine HBr (TRINTELLIX) 20 MG TABS Take 20 mg by mouth daily. 30 tablet 1   No current facility-administered medications for this visit.     Neurologic: Headache: No Seizure:  No Paresthesias:No  Musculoskeletal: Strength & Muscle Tone: within normal limits Gait & Station: normal Patient leans: N/A  Psychiatric Specialty Exam: Review of Systems  Eyes: Positive for pain.  Neurological: Positive for headaches.  Psychiatric/Behavioral: Positive for depression. The patient is nervous/anxious.     Blood pressure 101/62, pulse 63, temperature 98.4 F (36.9 C), temperature source Oral, weight 163 lb 6.4 oz (74.1 kg).Body mass index is 24.13 kg/m.  General Appearance: Casual  Eye Contact:  Fair  Speech:  Clear and Coherent  Volume:  Decreased  Mood:  Anxious and Depressed  Affect:  Appropriate  Thought Process:  Coherent  Orientation:  Full (Time, Place, and Person)  Thought Content:  Logical  Suicidal Thoughts:  No  Homicidal Thoughts:  No  Memory:  Immediate;   Fair Recent;   Fair Remote;   Fair  Judgement:  Fair  Insight:  Fair  Psychomotor Activity:  Normal  Concentration:  Concentration: Fair and Attention Span: Poor  Recall:  Poor  Fund of Knowledge:Poor  Language: Poor  Akathisia:  No  Handed:  Right  AIMS (if indicated):    Assets:  Communication Skills Desire for Improvement Physical Health Social Support  ADL's:  Intact  Cognition: WNL  Sleep:  fair    Treatment Plan Summary: Medication management  Discussed with patient about the medications.  Start her on Pristiq 50 mg daily.  Continue Abilify 5 mg by mouth daily at bedtime  Advised her to take melatonin 3 mg by mouth daily at bedtime when necessary for insomnia. She agreed with the plan. Follow-up in 1 month or earlier depending on her symptoms.  More than 50% of the time spent in psychoeducation, counseling and coordination of care.    This note was generated in part or whole with voice recognition software. Voice regonition is usually quite accurate but there are transcription errors that can and very often do occur. I apologize for any typographical errors that were not  detected and corrected.   Nargis HaleUzma Ibrahem Volkman, MD 3/5/20189:05 AM

## 2016-05-25 ENCOUNTER — Ambulatory Visit (INDEPENDENT_AMBULATORY_CARE_PROVIDER_SITE_OTHER): Payer: BLUE CROSS/BLUE SHIELD | Admitting: Psychiatry

## 2016-05-25 ENCOUNTER — Encounter: Payer: Self-pay | Admitting: Psychiatry

## 2016-05-25 VITALS — BP 106/68 | HR 76 | Temp 98.7°F | Wt 162.4 lb

## 2016-05-25 DIAGNOSIS — F331 Major depressive disorder, recurrent, moderate: Secondary | ICD-10-CM | POA: Diagnosis not present

## 2016-05-25 MED ORDER — DESVENLAFAXINE SUCCINATE ER 50 MG PO TB24: 50.0000 mg | ORAL_TABLET | Freq: Every day | ORAL | 1 refills | Status: DC

## 2016-05-25 NOTE — Progress Notes (Signed)
Psychiatric MD Progress Note   Patient Identification: Susan Garner MRN:  [id number] Date of Evaluation:  05/25/2016 Referral Source: previous pt  Chief Complaint:   Chief Complaint    Follow-up; Medication Refill     Visit Diagnosis:    ICD-9-CM ICD-10-CM   1. Major depressive disorder, recurrent episode, moderate (HCC) 296.32 F33.1     History of Present Illness:   Patient is a 46 year old female who presented for follow-up. She reorted that she has been feeling very tired Due to the Abilify for 1 month and stopped the medication after that. She reported that she does not have any motivation after coming back from work and she spends most of the time in bed. She has a 42 year old daughter. Patient reported that she stays in the bed most of the time on the weekend as well. She reported that she feels that she does not remember anything .She denies using any drugs or alcohol. 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 I have ordered her lab work which the patient has not completed so far. She reported that she has been taking Pristiq as prescribed and is not having any side effects. She appeared well groomed during the interview.   She reported that she has been depressed since she had her relationship changed in September.  She does not have any outdoor physical activities at this time. She just comes back from work and stays at home with her daughter.     Associated Signs/Symptoms: Depression Symptoms:  depressed mood, anhedonia, fatigue, hopelessness, decreased appetite, (Hypo) Manic Symptoms:  Distractibility, Irritable Mood, Labiality of Mood, Anxiety Symptoms:  Excessive Worry, Psychotic Symptoms:  none PTSD Symptoms: Negative NA  Past Psychiatric History:  H/o Depression. She does not have any history of psychiatric hospitalization. She denied having any history of suicide attempts.   Previous Psychotropic Medications:   wellbutrin celexa Xanax Elavil prozac Lexapro Cymbalta effexor  Substance Abuse History in the last 12 months:  No.  Consequences of Substance Abuse: Negative NA  Past Medical History:  Past Medical History:  Diagnosis Date  . Anxiety   . Depression   . Headache   . Kidney stones   . Ovarian cyst     Past Surgical History:  Procedure Laterality Date  . FEMUR FRACTURE SURGERY Right 1980's  . TONSILLECTOMY    . TUBAL LIGATION      Family Psychiatric History:   Mother- ? Bipolar  Family History:  Family History  Problem Relation Age of Onset  . Anxiety disorder Mother   . Cancer Neg Hx   . Diabetes Neg Hx   . Heart disease Neg Hx     Social History:   Social History   Social History  . Marital status: Single    Spouse name: N/A  . Number of children: N/A  . Years of education: N/A   Social History Main Topics  . Smoking status: Never Smoker  . Smokeless tobacco: Never Used  . Alcohol use No  . Drug use: No  . Sexual activity: Not Currently    Birth control/ protection: Surgical   Other Topics Concern  . None   Social History Narrative  . None    Additional Social History:  Lives with 66 yo daughter who has learning disability.    Allergies:  No Known Allergies  Metabolic Disorder Labs: Lab Results  Component Value Date   HGBA1C 5.3 11/25/2014   No results found for:  PROLACTIN No results found for: CHOL, TRIG, HDL, CHOLHDL, VLDL, LDLCALC   Current Medications: Current Outpatient Prescriptions  Medication Sig Dispense Refill  . desvenlafaxine (PRISTIQ) 50 MG 24 hr tablet Take 1 tablet (50 mg total) by mouth daily. 30 tablet 1   No current facility-administered medications for this visit.     Neurologic: Headache: No Seizure: No Paresthesias:No  Musculoskeletal: Strength & Muscle Tone: within normal limits Gait & Station: normal Patient leans: N/A  Psychiatric Specialty Exam: Review of Systems  Eyes: Positive for pain.   Neurological: Positive for headaches.  Psychiatric/Behavioral: Positive for depression. The patient is nervous/anxious.     Blood pressure 106/68, pulse 76, temperature 98.7 F (37.1 C), temperature source Oral, weight 162 lb 6.4 oz (73.7 kg).Body mass index is 23.98 kg/m.  General Appearance: Casual  Eye Contact:  Fair  Speech:  Clear and Coherent  Volume:  Decreased  Mood:  Anxious and Depressed  Affect:  Appropriate  Thought Process:  Coherent  Orientation:  Full (Time, Place, and Person)  Thought Content:  Logical  Suicidal Thoughts:  No  Homicidal Thoughts:  No  Memory:  Immediate;   Fair Recent;   Fair Remote;   Fair  Judgement:  Fair  Insight:  Fair  Psychomotor Activity:  Normal  Concentration:  Concentration: Fair and Attention Span: Poor  Recall:  Poor  Fund of Knowledge:Poor  Language: Poor  Akathisia:  No  Handed:  Right  AIMS (if indicated):    Assets:  Communication Skills Desire for Improvement Physical Health Social Support  ADL's:  Intact  Cognition: WNL  Sleep:  fair    Treatment Plan Summary: Medication management  Discussed with patient about the medications.  Start her on Pristiq 50 mg daily.  Discontinue Abilify as the patient has stopped taking the medication Advised  to start taking the vitamin D and increase the physical activity including exercises and she agreed with the plan Follow-up in 2 month or earlier depending on her symptoms.  More than 50% of the time spent in psychoeducation, counseling and coordination of care.    This note was generated in part or whole with voice recognition software. Voice regonition is usually quite accurate but there are transcription errors that can and very often do occur. I apologize for any typographical errors that were not detected and corrected.   Anniebell Hale, MD 4/4/20189:33 AM

## 2016-07-25 ENCOUNTER — Ambulatory Visit: Payer: BLUE CROSS/BLUE SHIELD | Admitting: Psychiatry

## 2016-08-22 ENCOUNTER — Other Ambulatory Visit: Payer: Self-pay | Admitting: Psychiatry

## 2016-08-26 ENCOUNTER — Telehealth: Payer: Self-pay

## 2016-08-26 ENCOUNTER — Other Ambulatory Visit: Payer: Self-pay | Admitting: Psychiatry

## 2016-08-26 MED ORDER — DESVENLAFAXINE SUCCINATE ER 50 MG PO TB24: 50.0000 mg | ORAL_TABLET | Freq: Every day | ORAL | 1 refills | Status: DC

## 2016-08-26 NOTE — Telephone Encounter (Signed)
pt called states that she needs a refill on her medications.  [facility] [place]. pt was last seen on  05-25-16 next appt on  10-03-16.  Pt is not taking effexor anymore.   This is a dr. Garnetta Buddyfaheem pt.

## 2016-08-26 NOTE — Telephone Encounter (Signed)
Okay. I will refill it.

## 2016-10-03 ENCOUNTER — Ambulatory Visit: Payer: BLUE CROSS/BLUE SHIELD | Admitting: Psychiatry

## 2016-10-06 ENCOUNTER — Other Ambulatory Visit: Payer: Self-pay | Admitting: Obstetrics and Gynecology

## 2016-10-06 DIAGNOSIS — Z1231 Encounter for screening mammogram for malignant neoplasm of breast: Secondary | ICD-10-CM

## 2016-10-18 ENCOUNTER — Ambulatory Visit (INDEPENDENT_AMBULATORY_CARE_PROVIDER_SITE_OTHER): Payer: BLUE CROSS/BLUE SHIELD | Admitting: Psychiatry

## 2016-10-18 ENCOUNTER — Encounter: Payer: Self-pay | Admitting: Psychiatry

## 2016-10-18 VITALS — BP 117/78 | HR 66 | Temp 98.9°F | Wt 163.0 lb

## 2016-10-18 DIAGNOSIS — F331 Major depressive disorder, recurrent, moderate: Secondary | ICD-10-CM

## 2016-10-18 MED ORDER — GABAPENTIN 100 MG PO CAPS: 100.0000 mg | ORAL_CAPSULE | Freq: Two times a day (BID) | ORAL | 2 refills | Status: DC

## 2016-10-18 MED ORDER — DESVENLAFAXINE SUCCINATE ER 50 MG PO TB24: 50.0000 mg | ORAL_TABLET | Freq: Every day | ORAL | 2 refills | Status: DC

## 2016-10-18 NOTE — Progress Notes (Signed)
Psychiatric MD Progress Note   Patient Identification: Susan Garner MRN:  [id number] Date of Evaluation:  10/18/2016 Referral Source: previous pt  Chief Complaint:   Chief Complaint    Follow-up; Medication Refill     Visit Diagnosis:    ICD-10-CM   1. Major depressive disorder, recurrent episode, moderate (HCC) F33.1     History of Present Illness:   Patient is a 46 year old female who presented for follow-up. She was last seen in April. She reported that she is working long hours and does not have time to come back for the appointments. She stated that she has been taking Pristiq on a daily basis. Patient also reported that she is having leg pain which is bothersome. She was having pain in her legs during this appointment as well. We discussed about starting her on the Neurontin and she agreed with the plan. She reported that she feels tired most of the time. We discussed about starting some exercise on a daily basis. Patient currently denied having any suicidal homicidal ideations or plans. She denied having any perceptual disturbances. 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        Associated Signs/Symptoms: Depression Symptoms:  fatigue, decreased appetite, (Hypo) Manic Symptoms:  Distractibility, Irritable Mood, Labiality of Mood, Anxiety Symptoms:  Excessive Worry, Psychotic Symptoms:  none PTSD Symptoms: Negative NA  Past Psychiatric History:  H/o Depression. She does not have any history of psychiatric hospitalization. She denied having any history of suicide attempts.   Previous Psychotropic Medications:  wellbutrin celexa Xanax Elavil prozac Lexapro Cymbalta effexor  Substance Abuse History in the last 12 months:  No.  Consequences of Substance Abuse: Negative NA  Past Medical History:  Past Medical History:  Diagnosis Date  . Anxiety   . Depression   . Headache   . Kidney stones   . Ovarian  cyst     Past Surgical History:  Procedure Laterality Date  . FEMUR FRACTURE SURGERY Right 1980's  . TONSILLECTOMY    . TUBAL LIGATION      Family Psychiatric History:   Mother- ? Bipolar  Family History:  Family History  Problem Relation Age of Onset  . Anxiety disorder Mother   . Cancer Neg Hx   . Diabetes Neg Hx   . Heart disease Neg Hx     Social History:   Social History   Social History  . Marital status: Single    Spouse name: N/A  . Number of children: N/A  . Years of education: N/A   Social History Main Topics  . Smoking status: Never Smoker  . Smokeless tobacco: Never Used  . Alcohol use No  . Drug use: No  . Sexual activity: Not Currently    Birth control/ protection: Surgical   Other Topics Concern  . None   Social History Narrative  . None    Additional Social History:  Lives with 6 yo daughter who has learning disability.    Allergies:  No Known Allergies  Metabolic Disorder Labs: Lab Results  Component Value Date   HGBA1C 5.3 11/25/2014   No results found for: PROLACTIN No results found for: CHOL, TRIG, HDL, CHOLHDL, VLDL, LDLCALC   Current Medications: Current Outpatient Prescriptions  Medication Sig Dispense Refill  . desvenlafaxine (PRISTIQ) 50 MG 24 hr tablet Take 1 tablet (50 mg total) by mouth daily. 30 tablet 2  . gabapentin (NEURONTIN) 100 MG capsule Take 1  capsule (100 mg total) by mouth 2 (two) times daily. 60 capsule 2   No current facility-administered medications for this visit.     Neurologic: Headache: No Seizure: No Paresthesias:No  Musculoskeletal: Strength & Muscle Tone: within normal limits Gait & Station: normal Patient leans: N/A  Psychiatric Specialty Exam: Review of Systems  Eyes: Positive for pain.  Neurological: Positive for headaches.  Psychiatric/Behavioral: Positive for depression. The patient is nervous/anxious.     Blood pressure 117/78, pulse 66, temperature 98.9 F (37.2 C),  temperature source Oral, weight 163 lb (73.9 kg), last menstrual period 10/11/2016.Body mass index is 24.07 kg/m.  General Appearance: Casual  Eye Contact:  Fair  Speech:  Clear and Coherent  Volume:  Decreased  Mood:  Anxious and Depressed  Affect:  Appropriate  Thought Process:  Coherent  Orientation:  Full (Time, Place, and Person)  Thought Content:  Logical  Suicidal Thoughts:  No  Homicidal Thoughts:  No  Memory:  Immediate;   Fair Recent;   Fair Remote;   Fair  Judgement:  Fair  Insight:  Fair  Psychomotor Activity:  Normal  Concentration:  Concentration: Fair and Attention Span: Poor  Recall:  Poor  Fund of Knowledge:Good  Language: Fair  Akathisia:  No  Handed:  Right  AIMS (if indicated):    Assets:  Communication Skills Desire for Improvement Physical Health Social Support  ADL's:  Intact  Cognition: WNL  Sleep:  fair    Treatment Plan Summary: Medication management  Discussed with patient about the medications.   Pristiq 50 mg daily.  Start Neurontin 100 mg daily and advised her to decrease the dose to 200 mg at bedtime. She agreed with the plan. Advised  to start taking the vitamin D and increase the physical activity including exercises and she agreed with the plan Follow-up in 3 month or earlier depending on her symptoms.  More than 50% of the time spent in psychoeducation, counseling and coordination of care.    This note was generated in part or whole with voice recognition software. Voice regonition is usually quite accurate but there are transcription errors that can and very often do occur. I apologize for any typographical errors that were not detected and corrected.   Gwyndolyn Hale, MD 8/28/20189:20 AM

## 2016-11-09 ENCOUNTER — Ambulatory Visit
Admission: RE | Admit: 2016-11-09 | Discharge: 2016-11-09 | Disposition: A | Discharge status: Home / Self Care | Payer: BLUE CROSS/BLUE SHIELD | Source: Ambulatory Visit | Attending: Obstetrics and Gynecology | Admitting: Obstetrics and Gynecology

## 2016-11-09 DIAGNOSIS — Z1231 Encounter for screening mammogram for malignant neoplasm of breast: Secondary | ICD-10-CM | POA: Diagnosis not present

## 2016-11-23 ENCOUNTER — Other Ambulatory Visit: Payer: Self-pay | Admitting: *Deleted

## 2016-11-23 MED ORDER — VALACYCLOVIR HCL 1 G PO TABS: 1000.0000 mg | ORAL_TABLET | Freq: Two times a day (BID) | ORAL | 2 refills | Status: DC

## 2017-01-16 ENCOUNTER — Ambulatory Visit: Payer: BLUE CROSS/BLUE SHIELD | Admitting: Psychiatry

## 2017-01-23 ENCOUNTER — Other Ambulatory Visit: Payer: Self-pay | Admitting: Psychiatry

## 2017-01-25 ENCOUNTER — Encounter: Payer: Self-pay | Admitting: Psychiatry

## 2017-01-25 ENCOUNTER — Other Ambulatory Visit: Payer: Self-pay

## 2017-01-25 ENCOUNTER — Ambulatory Visit (INDEPENDENT_AMBULATORY_CARE_PROVIDER_SITE_OTHER): Payer: BLUE CROSS/BLUE SHIELD | Admitting: Psychiatry

## 2017-01-25 VITALS — BP 120/79 | HR 67 | Temp 98.4°F

## 2017-01-25 DIAGNOSIS — F331 Major depressive disorder, recurrent, moderate: Secondary | ICD-10-CM | POA: Diagnosis not present

## 2017-01-25 MED ORDER — DESVENLAFAXINE SUCCINATE ER 50 MG PO TB24: 50.0000 mg | ORAL_TABLET | Freq: Every day | ORAL | 2 refills | Status: DC

## 2017-01-25 NOTE — Progress Notes (Signed)
BH MD OP Progress Note  01/25/2017 7:38 PM Susan Garner  MRN:  [id number]  Chief Complaint: ' I need my medications."  Chief Complaint    Follow-up; Medication Refill     HPI: Susan Garner is a [age] y old CF who is single , lives in [place], is employed , has a hx of depression, presented to the clinic today for a follow up visit.  Susan Garner is Dr.Faheem's patient. She presented today since she needed a refill on her medications.  Susan Garner reports she is currently doing well. Susan Garner denies any side effects to medications and reports she is compliant. Susan Garner takes Pristiq 50 mg and states she has been on it for a long time.  Areeba reports sleep as fair, appetite as fair. Denies suicidality or homicidality. Denies perceptual disturbances.  Susan Garner continues to have some LE pain , usually at night. She was prescribed gabapentin for the same. She reports she is going to touch base with her PMD regarding this and wants to R/O medical issues that could be contributing to the same.  Denies any other concerns today.  Visit Diagnosis:    ICD-10-CM   1. MDD (major depressive disorder), recurrent episode, moderate (HCC) F33.1 desvenlafaxine (PRISTIQ) 50 MG 24 hr tablet    Past Psychiatric History: Hx of depression, denies IP mental health admissions, denies suicide attempts . Past trials of wellbutrin, celexa, xanax, elavil, prozac, lexapro, cymbalta, effexor  Past Medical History:  Past Medical History:  Diagnosis Date  . Anxiety   . Depression   . Headache   . Kidney stones   . Ovarian cyst     Past Surgical History:  Procedure Laterality Date  . FEMUR FRACTURE SURGERY Right 1980's  . TONSILLECTOMY    . TUBAL LIGATION      Family Psychiatric History: Mother ?bipolar do  Family History:  Family History  Problem Relation Age of Onset  . Anxiety disorder Mother   . Cancer Neg Hx   . Diabetes Neg Hx   . Heart disease Neg Hx     Social History: single, employed , [place] in [place],  has a 45 yr old daughter. Has support from parents and friends. Social History   Socioeconomic History  . Marital status: Single    Spouse name: None  . Number of children: None  . Years of education: None  . Highest education level: None  Social Needs  . Financial resource strain: None  . Food insecurity - worry: None  . Food insecurity - inability: None  . Transportation needs - medical: None  . Transportation needs - non-medical: None  Occupational History  . None  Tobacco Use  . Smoking status: Never Smoker  . Smokeless tobacco: Never Used  Substance and Sexual Activity  . Alcohol use: No    Alcohol/week: 0.0 oz  . Drug use: No  . Sexual activity: Not Currently    Birth control/protection: Surgical  Other Topics Concern  . None  Social History Narrative  . None    Allergies: No Known Allergies  Metabolic Disorder Labs: Lab Results  Component Value Date   HGBA1C 5.3 11/25/2014   No results found for: PROLACTIN No results found for: CHOL, TRIG, HDL, CHOLHDL, VLDL, LDLCALC Lab Results  Component Value Date   TSH 1.370 11/25/2014    Therapeutic Level Labs: No results found for: LITHIUM No results found for: VALPROATE No components found for:  CBMZ  Current Medications: Current Outpatient Medications  Medication Sig Dispense Refill  .  desvenlafaxine (PRISTIQ) 50 MG 24 hr tablet Take 1 tablet (50 mg total) by mouth daily. 30 tablet 2  . gabapentin (NEURONTIN) 100 MG capsule Take 1 capsule (100 mg total) by mouth 2 (two) times daily. 60 capsule 2  . valACYclovir (VALTREX) 1000 MG tablet Take 1 tablet (1,000 mg total) by mouth 2 (two) times daily. 60 tablet 2   No current facility-administered medications for this visit.      Musculoskeletal: Strength & Muscle Tone: within normal limits Gait & Station: normal Patient leans: N/A  Psychiatric Specialty Exam: Review of Systems  Psychiatric/Behavioral: Positive for depression (stable).  All other systems  reviewed and are negative.   Blood pressure 120/79, pulse 67, temperature 98.4 F (36.9 C), temperature source Oral.There is no height or weight on file to calculate BMI.  General Appearance: Casual  Eye Contact:  Fair  Speech:  Clear and Coherent  Volume:  Normal  Mood:  Euthymic  Affect:  Appropriate  Thought Process:  Goal Directed and Descriptions of Associations: Intact  Orientation:  Full (Time, Place, and Person)  Thought Content: Logical   Suicidal Thoughts:  No  Homicidal Thoughts:  No  Memory:  Immediate;   Fair Recent;   Fair Remote;   Fair  Judgement:  Fair  Insight:  Fair  Psychomotor Activity:  Normal  Concentration:  Concentration: Fair and Attention Span: Fair  Recall:  [employer]Fair  Fund of Knowledge: Fair  Language: Fair  Akathisia:  No  Handed:  Right  AIMS (if indicated): NA  Assets:  Communication Skills Desire for Improvement Financial Resources/Insurance Housing Physical Health Resilience Social Support Talents/Skills Transportation Vocational/Educational  ADL's:  Intact  Cognition: WNL  Sleep:  Fair   Screenings: AIMS     [facility]  AIMS Total Score  0    PHQ2-9     [facility]  PHQ-2 Total Score  3  3  3   PHQ-9 Total Score  10  12  16        Assessment and Plan: Susan Garner has a hx of depression, follows up with Dr.Faheem. Presents today for medication management. Denies new concerns. Depressive sx are currently stable on Pristiq. Continue medications as noted below.  Plan  For depression  Pristiq 50 mg po daily. Continue Gabapentin as prescribed for pain. She will follow up with PMD regarding the same.  Follow up in 3 months with Dr.Faheem.  More than 50 % of the time was spent for psychoeducation and  supportive psychotherapy and care coordination.    Jomarie LongsSaramma Ceci Taliaferro, MD 01/25/2017, 7:38 PM

## 2017-04-25 ENCOUNTER — Ambulatory Visit: Payer: BLUE CROSS/BLUE SHIELD | Admitting: Psychiatry

## 2017-04-25 ENCOUNTER — Other Ambulatory Visit: Payer: Self-pay

## 2017-04-25 ENCOUNTER — Encounter: Payer: Self-pay | Admitting: Psychiatry

## 2017-04-25 VITALS — BP 111/71 | HR 69 | Temp 97.9°F | Wt 167.4 lb

## 2017-04-25 DIAGNOSIS — F419 Anxiety disorder, unspecified: Secondary | ICD-10-CM | POA: Diagnosis not present

## 2017-04-25 DIAGNOSIS — F331 Major depressive disorder, recurrent, moderate: Secondary | ICD-10-CM

## 2017-04-25 MED ORDER — GABAPENTIN 100 MG PO CAPS: 100.0000 mg | ORAL_CAPSULE | Freq: Two times a day (BID) | ORAL | 2 refills | Status: DC

## 2017-04-25 MED ORDER — HYDROXYZINE HCL 10 MG PO TABS: 10.0000 mg | ORAL_TABLET | Freq: Three times a day (TID) | ORAL | 2 refills | Status: DC | PRN

## 2017-04-25 MED ORDER — DESVENLAFAXINE SUCCINATE ER 50 MG PO TB24: 50.0000 mg | ORAL_TABLET | Freq: Every day | ORAL | 2 refills | Status: DC

## 2017-04-25 NOTE — Progress Notes (Signed)
BH MD OP Progress Note  04/25/2017 8:50 AM Susan Garner  MRN:  [id number]  Chief Complaint: ' I am fine." Chief Complaint    Follow-up; Medication Refill     HPI: Susan Garner is a 47 year old Caucasian female who is single, lives in [place], is employed, has a history of depression, presented to the clinic today for a follow-up visit.  Susan Garner used to follow up with Dr. Garnetta Buddy in the past.  This is her second appointment with writer.  Patient today reports that she is currently doing well on the current dose of medication.  She continues to be compliant on Pristiq 50 mg daily.  She does report some increased sweating at nighttime, unknown if this is due to her current antidepressant.  Discussed with her to monitor herself closely.  She also does report some irritability and mood symptoms a couple of days prior to her menstrual period.  Ports she has been dealing with this since the past several years and it is not anything new for her.  She however wonders if there is anything that she can take to cope with her irritability during those time.  She reports sleep as fair, appetite is fair.  She denies any suicidality or homicidality at this time.  She did have some lower extremity pain usually at night in the past.  She was prescribed gabapentin for the same but she never filled the prescription.  She reports she changed her mattress recently and that has helped.  She however would like to have her gabapentin prescribed to her again just in case she needs help.  She reports she has started exercising like doing Zumba as well as walking with a friend.  She reports that has been going well.  She is a single mother and has a 48 year old daughter at home.  She continues to have a good relationship with her.   Visit Diagnosis:    ICD-10-CM   1. MDD (major depressive disorder), recurrent episode, moderate (HCC) F33.1 desvenlafaxine (PRISTIQ) 50 MG 24 hr tablet    hydrOXYzine (ATARAX/VISTARIL) 10  MG tablet  2. Anxiety disorder, unspecified type F41.9 gabapentin (NEURONTIN) 100 MG capsule    hydrOXYzine (ATARAX/VISTARIL) 10 MG tablet    Past Psychiatric History: Depression, denies IP mental health admissions, denies suicide attempts.  Past trials of Wellbutrin, Celexa, Xanax, Elavil, Prozac, Lexapro, Cymbalta, Effexor.  Past Medical History:  Past Medical History:  Diagnosis Date  . Anxiety   . Depression   . Headache   . Kidney stones   . Ovarian cyst     Past Surgical History:  Procedure Laterality Date  . FEMUR FRACTURE SURGERY Right 1980's  . TONSILLECTOMY    . TUBAL LIGATION      Family Psychiatric History: Mother - mental illness.  Family History:  Family History  Problem Relation Age of Onset  . Anxiety disorder Mother   . Cancer Neg Hx   . Diabetes Neg Hx   . Heart disease Neg Hx    Substance abuse history: Denies  Social History: Single, employed, lives in [place], has a 60 year old daughter.  Has support from parents and friends. Social History   Socioeconomic History  . Marital status: Single    Spouse name: None  . Number of children: None  . Years of education: None  . Highest education level: None  Social Needs  . Financial resource strain: None  . Food insecurity - worry: None  . Food insecurity - inability: None  . Transportation  needs - medical: None  . Transportation needs - non-medical: None  Occupational History  . None  Tobacco Use  . Smoking status: Never Smoker  . Smokeless tobacco: Never Used  Substance and Sexual Activity  . Alcohol use: No    Alcohol/week: 0.0 oz  . Drug use: No  . Sexual activity: Not Currently    Birth control/protection: Surgical  Other Topics Concern  . None  Social History Narrative  . None    Allergies: No Known Allergies  Metabolic Disorder Labs: Lab Results  Component Value Date   HGBA1C 5.3 11/25/2014   No results found for: PROLACTIN No results found for: CHOL, TRIG, HDL, CHOLHDL,  VLDL, LDLCALC Lab Results  Component Value Date   TSH 1.370 11/25/2014    Therapeutic Level Labs: No results found for: LITHIUM No results found for: VALPROATE No components found for:  CBMZ  Current Medications: Current Outpatient Medications  Medication Sig Dispense Refill  . desvenlafaxine (PRISTIQ) 50 MG 24 hr tablet Take 1 tablet (50 mg total) by mouth daily. 30 tablet 2  . gabapentin (NEURONTIN) 100 MG capsule Take 1 capsule (100 mg total) by mouth 2 (two) times daily. 60 capsule 2  . valACYclovir (VALTREX) 1000 MG tablet Take 1 tablet (1,000 mg total) by mouth 2 (two) times daily. 60 tablet 2  . hydrOXYzine (ATARAX/VISTARIL) 10 MG tablet Take 1-2 tablets (10-20 mg total) by mouth 3 (three) times daily as needed for anxiety. 180 tablet 2   No current facility-administered medications for this visit.      Musculoskeletal: Strength & Muscle Tone: within normal limits Gait & Station: normal Patient leans: N/A  Psychiatric Specialty Exam: Review of Systems  Psychiatric/Behavioral: The patient is nervous/anxious.   All other systems reviewed and are negative.   Blood pressure 111/71, pulse 69, temperature 97.9 F (36.6 C), temperature source Oral, weight 167 lb 6.4 oz (75.9 kg).Body mass index is 24.72 kg/m.  General Appearance: Casual  Eye Contact:  Fair  Speech:  Clear and Coherent  Volume:  Normal  Mood:  Anxious  Affect:  Appropriate  Thought Process:  Goal Directed and Descriptions of Associations: Intact  Orientation:  Full (Time, Place, and Person)  Thought Content: Logical   Suicidal Thoughts:  No  Homicidal Thoughts:  No  Memory:  Immediate;   Fair Recent;   Fair Remote;   Fair  Judgement:  Fair  Insight:  Fair  Psychomotor Activity:  Normal  Concentration:  Concentration: Fair and Attention Span: Fair  Recall:  [employer]Fair  Fund of Knowledge: Fair  Language: Fair  Akathisia:  No  Handed:  Right  AIMS (if indicated): NA  Assets:  Communication  Skills Desire for Improvement Housing Physical Health Resilience Social Support Talents/Skills Transportation  ADL's:  Intact  Cognition: WNL  Sleep:  Fair   Screenings: AIMS     [facility]  AIMS Total Score  0    PHQ2-9     [facility]  PHQ-2 Total Score  3  3  3   PHQ-9 Total Score  10  12  16        Assessment and Plan: [name] a history of depression, used to follow-up with Dr. Garnetta BuddyFaheem in the past.  She reports she continues to do well on the current medication regimen except  for  some irritability during her menstrual cycle.  She reports she has been dealing with mood swings during that time since the past several years.  She otherwise denies any new concerns.  Discussed plan as noted below.  Plan For depression Pristiq 50 mg p.o. daily Continue gabapentin 100 mg p.o. twice daily.  For anxiety symptoms Pristiq 50 mg p.o. daily Start Vistaril 10-20 mg p.o. 3 times daily as needed for severe anxiety symptoms.  Discussed coping techniques.  Follow-up in clinic in 3 months or sooner if needed.  More than 50 % of the time was spent for psychoeducation and supportive psychotherapy and care coordination.  This note was generated in part or whole with voice recognition software. Voice recognition is usually quite accurate but there are transcription errors that can and very often do occur. I apologize for any typographical errors that were not detected and corrected.       Jomarie Longs, MD 04/25/2017, 8:50 AM

## 2017-05-16 NOTE — Progress Notes (Deleted)
05/17/2017 11:24 PM   Susan Garner December 03, 1970 [id number]  Referring provider: Roselee Nova, MD [address] [place], [place] [address]  No chief complaint on file.   HPI: 47 yo WF with a history of nephrolithiasis who presents today requesting an appointment for urge incontinence.  Background history 47 year old female who presents today to establish care after developing severe onset of acute right pelvic/ flank pain.  She was seen and evaluated in the emergency room on 07/21/2015 for this. As part of her workup, she underwent KUB which demonstrated a 5 mm proximal right ureteral calculus along with a renal ultrasound which showed some mild renal pelvic fullness and some shadowing within the kidney possibly representing another nonobstructing stone. Her left kidney was normal.  Creatinine 0.84, recent BC 8.0, UA negative other than for blood/ protein.  Today, she continues to have some mild right lower quadrant pain which comes and goes but is not as severe. No nausea or vomiting and able to tolerate food and beverages.  No dysuria or gross hematuria or any other urinary symptoms.  She was given 10 day supply of Flomax, pain medications, and urinary strainer which she has been using. She has not seen the stone passed to date.  Prior to this episode, she has no personal history of kidney stones.  As admitted to eating a high salt diet, not much water, and drinks*[place] is on a regular basis. She is interested in learning how to prevent kidney stones in the future.  She was scheduled to undergo CT stone protocol by Dr. Enzo Bi next week.  She did not undergo the CT Renal stone study.  She pursued MET therapy and passed the stone.  No follow up imaging was performed.  Today, ***.       PMH: Past Medical History:  Diagnosis Date  . Anxiety   . Depression   . Headache   . Kidney stones   . Ovarian cyst     Surgical History: Past Surgical History:    Procedure Laterality Date  . FEMUR FRACTURE SURGERY Right 1980's  . TONSILLECTOMY    . TUBAL LIGATION      Home Medications:  Allergies as of 05/17/2017   No Known Allergies     Medication List        Accurate as of 05/16/17 11:24 PM. Always use your most recent med list.          desvenlafaxine 50 MG 24 hr tablet Commonly known as:  PRISTIQ Take 1 tablet (50 mg total) by mouth daily.   gabapentin 100 MG capsule Commonly known as:  NEURONTIN Take 1 capsule (100 mg total) by mouth 2 (two) times daily.   hydrOXYzine 10 MG tablet Commonly known as:  ATARAX/VISTARIL Take 1-2 tablets (10-20 mg total) by mouth 3 (three) times daily as needed for anxiety.   valACYclovir 1000 MG tablet Commonly known as:  VALTREX Take 1 tablet (1,000 mg total) by mouth 2 (two) times daily.       Allergies: No Known Allergies  Family History: Family History  Problem Relation Age of Onset  . Anxiety disorder Mother   . Cancer Neg Hx   . Diabetes Neg Hx   . Heart disease Neg Hx     Social History:  reports that she has never smoked. She has never used smokeless tobacco. She reports that she does not drink alcohol or use drugs.  ROS:  Physical Exam: There were no vitals taken for this visit.  Constitutional: Well nourished. Alert and oriented, No acute distress. HEENT:  AT, moist mucus membranes. Trachea midline, no masses. Cardiovascular: No clubbing, cyanosis, or edema. Respiratory: Normal respiratory effort, no increased work of breathing. GI: Abdomen is soft, non tender, non distended, no abdominal masses. Liver and spleen not palpable.  No hernias appreciated.  Stool sample for occult testing is not indicated.   GU: No CVA tenderness.  No bladder fullness or masses.  Patient with circumcised/uncircumcised phallus. ***Foreskin easily retracted***  Urethral meatus is patent.  No penile discharge. No penile lesions or  rashes. Scrotum without lesions, cysts, rashes and/or edema.  Testicles are located scrotally bilaterally. No masses are appreciated in the testicles. Left and right epididymis are normal. Rectal: Patient with  normal sphincter tone. Anus and perineum without scarring or rashes. No rectal masses are appreciated. Prostate is approximately *** grams, *** nodules are appreciated. Seminal vesicles are normal. Skin: No rashes, bruises or suspicious lesions. Lymph: No cervical or inguinal adenopathy. Neurologic: Grossly intact, no focal deficits, moving all 4 extremities. Psychiatric: Normal mood and affect.  Laboratory Data: Lab Results  Component Value Date   WBC 8.0 07/21/2015   HGB 13.1 07/21/2015   HCT 37.6 07/21/2015   MCV 87.2 07/21/2015   PLT 254 07/21/2015    Lab Results  Component Value Date   CREATININE 0.84 07/21/2015    Lab Results  Component Value Date   HGBA1C 5.3 11/25/2014    Urinalysis Results for orders placed or performed in visit on 07/22/15  Urine culture  Result Value Ref Range   Urine Culture, Routine Final report    Organism ID, Bacteria No growth   POCT urinalysis dipstick  Result Value Ref Range   Color, UA yellow    Clarity, UA clear    Glucose, UA neg    Bilirubin, UA neg    Ketones, UA neg    Spec Grav, UA >=1.030    Blood large    pH, UA 5.0    Protein, UA trace    Urobilinogen, UA 0.2    Nitrite, UA neg    Leukocytes, UA Negative Negative   I have reviewed the labs.  Pertinent Imaging:    Study Result     CLINICAL DATA: Acute onset of right lower quadrant abdominal pain and right flank pain earlier today.  EXAM: ABDOMEN - 1 VIEW  COMPARISON: CT abdomen pelvis 07/10/2006 and earlier.  FINDINGS: Bowel gas pattern unremarkable without evidence of obstruction or significant ileus. Moderate stool burden in the colon. Approximate 5 mm calcification projected over the right transverse process of L4, likely within the proximal  right ureter. No other visible opaque urinary tract calculi. Mild degenerative changes at the lumbosacral junction.  IMPRESSION: Approximate 5 mm proximal right ureteral calculus.   Electronically Signed  By: Evangeline Dakin M.D.  On: 07/21/2015 11:28      Study Result     CLINICAL DATA: Right flank pain. Rule out hydronephrosis.  EXAM: RENAL / URINARY TRACT ULTRASOUND COMPLETE  COMPARISON: CT abdomen pelvis 07/10/2006  FINDINGS: Right Kidney:  Length: 10.5 cm. Mild fullness left renal pelvis versus extra renal pelvis. No definite hydronephrosis. 6.9 mm shadowing stone midpole which is not causing obstruction.  Left Kidney:  Length: 11.3 cm. Echogenicity within normal limits. No mass or hydronephrosis visualized.  Bladder:  Normal bladder. Bilateral ureteral jets identified.  IMPRESSION: 6.9 mm nonobstructing stone right midpole. Possible mild fullness right renal  pelvis without definite hydronephrosis  Bilateral ureteral jets identified.   Electronically Signed  By: Franchot Gallo M.D.  On: 07/21/2015 11:03    I have independently reviewed the films  Assessment & Plan:    1. Urge incontinence   2. Nephrolithiasis  3. Right ureteral stone Stone was spontaneously passed  No follow-ups on file.  Zara Council, [facility] [address] [place], [place] [address] [phone]

## 2017-05-17 ENCOUNTER — Ambulatory Visit: Payer: Self-pay | Admitting: Urology

## 2017-07-21 ENCOUNTER — Other Ambulatory Visit: Payer: Self-pay | Admitting: Psychiatry

## 2017-07-21 DIAGNOSIS — F331 Major depressive disorder, recurrent, moderate: Secondary | ICD-10-CM

## 2017-07-26 ENCOUNTER — Ambulatory Visit: Payer: BLUE CROSS/BLUE SHIELD | Admitting: Psychiatry

## 2017-08-25 ENCOUNTER — Ambulatory Visit: Payer: BLUE CROSS/BLUE SHIELD | Admitting: Psychiatry

## 2017-09-25 ENCOUNTER — Ambulatory Visit: Payer: BLUE CROSS/BLUE SHIELD | Admitting: Psychiatry

## 2017-10-11 ENCOUNTER — Encounter: Payer: Self-pay | Admitting: Psychiatry

## 2017-10-11 ENCOUNTER — Other Ambulatory Visit: Payer: Self-pay

## 2017-10-11 ENCOUNTER — Ambulatory Visit: Payer: BLUE CROSS/BLUE SHIELD | Admitting: Psychiatry

## 2017-10-11 VITALS — BP 119/79 | HR 70 | Temp 98.4°F | Wt 168.8 lb

## 2017-10-11 DIAGNOSIS — F5105 Insomnia due to other mental disorder: Secondary | ICD-10-CM | POA: Diagnosis not present

## 2017-10-11 DIAGNOSIS — F419 Anxiety disorder, unspecified: Secondary | ICD-10-CM

## 2017-10-11 DIAGNOSIS — F331 Major depressive disorder, recurrent, moderate: Secondary | ICD-10-CM | POA: Diagnosis not present

## 2017-10-11 MED ORDER — QUETIAPINE FUMARATE 25 MG PO TABS: 12.5000 mg | ORAL_TABLET | Freq: Every day | ORAL | 0 refills | Status: DC

## 2017-10-11 MED ORDER — PROPRANOLOL HCL 10 MG PO TABS: 10.0000 mg | ORAL_TABLET | Freq: Three times a day (TID) | ORAL | 1 refills | Status: DC | PRN

## 2017-10-11 MED ORDER — DESVENLAFAXINE SUCCINATE ER 25 MG PO TB24: 75.0000 mg | ORAL_TABLET | Freq: Every day | ORAL | 1 refills | Status: DC

## 2017-10-11 NOTE — Progress Notes (Signed)
BH MD  OP Progress Note  10/11/2017 4:37 PM Susan Garner  MRN:  [id number]  Chief Complaint: ' I am here for follow up." Chief Complaint    Follow-up; Medication Refill     HPI: [name] is a 47 year old Caucasian female, single, lives in [place],, employed, has a history of depression, presented to the clinic today for a follow-up visit.  Patient today reports that she is having some mood lability, anxiety symptoms and sleep problems.  She reports she is going through several psychosocial stressors.  She reports each time she goes through some kind of stress or transition she feels overwhelmed and has mood lability.  Patient reports there are days when she feels extremely depressed and other days when she feels like she is anxious and doing a lot of work at her office.  Patient reports her office needs have also changed since the past 1 year or so.  Patient reports there are certain goals that she needs to meet which she is unable to and that makes her more anxious.  Patient reports a lot of racing thoughts at night since she is worried about her psychosocial stressors.  Patient reports being a single mother also overwhelms her.  Patient reports she recently had some trouble with her car which is another stressor for her.  Patient does not think the Pristiq at this time is helping.  She reports there were a few times she was having irritability and anger at work .  However denies any manic or hypomanic symptoms and it is likely that her anger or irritability at work may have been due to frustration or due to anxiety or depressive symptoms.  Patient denies any suicidality.  Patient denies any perceptual disturbances.  Discussed referral for intensive outpatient program as well as referral to individual psychotherapist.  Also discussed medication readjustment. Visit Diagnosis:    ICD-10-CM   1. MDD (major depressive disorder), recurrent episode, moderate (HCC) F33.1 QUEtiapine (SEROQUEL) 25  MG tablet    Desvenlafaxine Succinate ER (PRISTIQ) 25 MG TB24  2. Anxiety disorder, unspecified type F41.9 QUEtiapine (SEROQUEL) 25 MG tablet    Desvenlafaxine Succinate ER (PRISTIQ) 25 MG TB24  3. Insomnia due to mental disorder F51.05 QUEtiapine (SEROQUEL) 25 MG tablet    Past Psychiatric History: Reviewed past psychiatric history from my progress note on 04/25/2017.  Past trials of Wellbutrin, Celexa, Xanax, Elavil, Prozac, Lexapro, Cymbalta, Effexor.  Past Medical History:  Past Medical History:  Diagnosis Date  . Anxiety   . Depression   . Headache   . Kidney stones   . Ovarian cyst     Past Surgical History:  Procedure Laterality Date  . FEMUR FRACTURE SURGERY Right 1980's  . TONSILLECTOMY    . TUBAL LIGATION      Family Psychiatric History: I have reviewed family psychiatric history from my progress note on 04/25/2017  Family History:  Family History  Problem Relation Age of Onset  . Anxiety disorder Mother   . Cancer Neg Hx   . Diabetes Neg Hx   . Heart disease Neg Hx     Social History: Reviewed social history from my progress note on 04/25/2017 Social History   Socioeconomic History  . Marital status: Single    Spouse name: Not on file  . Number of children: Not on file  . Years of education: Not on file  . Highest education level: Not on file  Occupational History  . Not on file  Social Needs  .  Financial resource strain: Not on file  . Food insecurity:    Worry: Not on file    Inability: Not on file  . Transportation needs:    Medical: Not on file    Non-medical: Not on file  Tobacco Use  . Smoking status: Never Smoker  . Smokeless tobacco: Never Used  Substance and Sexual Activity  . Alcohol use: No    Alcohol/week: 0.0 standard drinks  . Drug use: No  . Sexual activity: Not Currently    Birth control/protection: Surgical  Lifestyle  . Physical activity:    Days per week: Not on file    Minutes per session: Not on file  . Stress: Not on file   Relationships  . Social connections:    Talks on phone: Not on file    Gets together: Not on file    Attends religious service: Not on file    Active member of club or organization: Not on file    Attends meetings of clubs or organizations: Not on file    Relationship status: Not on file  Other Topics Concern  . Not on file  Social History Narrative  . Not on file    Allergies: No Known Allergies  Metabolic Disorder Labs: Lab Results  Component Value Date   HGBA1C 5.3 11/25/2014   No results found for: PROLACTIN No results found for: CHOL, TRIG, HDL, CHOLHDL, VLDL, LDLCALC Lab Results  Component Value Date   TSH 1.370 11/25/2014    Therapeutic Level Labs: No results found for: LITHIUM No results found for: VALPROATE No components found for:  CBMZ  Current Medications: Current Outpatient Medications  Medication Sig Dispense Refill  . gabapentin (NEURONTIN) 100 MG capsule Take 1 capsule (100 mg total) by mouth 2 (two) times daily. 60 capsule 2  . valACYclovir (VALTREX) 1000 MG tablet Take 1 tablet (1,000 mg total) by mouth 2 (two) times daily. 60 tablet 2  . Desvenlafaxine Succinate ER (PRISTIQ) 25 MG TB24 Take 75 mg by mouth daily. 90 tablet 1  . propranolol (INDERAL) 10 MG tablet Take 1 tablet (10 mg total) by mouth 3 (three) times daily as needed. Anxiety sx 90 tablet 1  . QUEtiapine (SEROQUEL) 25 MG tablet Take 0.5-1 tablets (12.5-25 mg total) by mouth at bedtime. 30 tablet 0   No current facility-administered medications for this visit.      Musculoskeletal: Strength & Muscle Tone: within normal limits Gait & Station: normal Patient leans: N/A  Psychiatric Specialty Exam: Review of Systems  Psychiatric/Behavioral: Positive for depression. The patient is nervous/anxious and has insomnia.   All other systems reviewed and are negative.   Blood pressure 119/79, pulse 70, temperature 98.4 F (36.9 C), temperature source Oral, weight 168 lb 12.8 oz (76.6  kg).Body mass index is 24.93 kg/m.  General Appearance: Casual  Eye Contact:  Fair  Speech:  Clear and Coherent  Volume:  Normal  Mood:  Anxious and Depressed  Affect:  Congruent  Thought Process:  Goal Directed and Descriptions of Associations: Intact  Orientation:  Full (Time, Place, and Person)  Thought Content: Logical   Suicidal Thoughts:  No  Homicidal Thoughts:  No  Memory:  Immediate;   Fair Recent;   Fair Remote;   Fair  Judgement:  Fair  Insight:  Fair  Psychomotor Activity:  Normal  Concentration:  Concentration: Fair and Attention Span: Fair  Recall:  [employer]Fair  Fund of Knowledge: Fair  Language: Fair  Akathisia:  No  Handed:  Right  AIMS (if indicated): na  Assets:  Communication Skills Desire for Improvement Social Support  ADL's:  Intact  Cognition: WNL  Sleep:  Poor   Screenings: AIMS     [facility] Visit from 04/25/2016 [facility]  AIMS Total Score  0    PHQ2-9     Office Visit from 05/12/2015 in [facility] Office Visit from 04/16/2015 in [facility] Office Visit from 11/25/2014 in [facility]  PHQ-2 Total Score  3  3  3   PHQ-9 Total Score  10  12  16        Assessment and Plan: Wylma is a 47 year old Caucasian female who has a history of depression, anxiety symptoms, presented to the clinic today for a follow-up visit.  Patient struggles with mood lability, irritability, sleep problems and anxiety symptoms.  Patient does have several psycho social stressors including changes at her work as well as being a single mother and financial problems.  Patient will benefit from medication readjustment and possible referral to intensive outpatient program/individual therapy.  Plan For depression Increase Pristiq to 75 mg p.o. daily Seroquel 12.5-25 mg p.o. nightly  For anxiety symptoms Pristiq 75 mg p.o. daily Add propranolol 10 mg p.o. 3 times daily as needed for severe  anxiety symptoms  Discussed referral to intensive outpatient program.  Also will make a referral to Ms. Felecia Jan for CBT.  Follow-up in clinic in 10 days or sooner if needed.  More than 50 % of the time was spent for psychoeducation and supportive psychotherapy and care coordination.  This note was generated in part or whole with voice recognition software. Voice recognition is usually quite accurate but there are transcription errors that can and very often do occur. I apologize for any typographical errors that were not detected and corrected.         Jomarie Longs, MD 10/11/2017, 4:37 PM

## 2017-10-11 NOTE — Patient Instructions (Signed)
Quetiapine tablets What is this medicine? QUETIAPINE (kwe TYE a peen) is an antipsychotic. It is used to treat schizophrenia and bipolar disorder, also known as manic-depression. This medicine may be used for other purposes; ask your health care provider or pharmacist if you have questions. COMMON BRAND NAME(S): Seroquel What should I tell my health care provider before I take this medicine? They need to know if you have any of these conditions: -brain tumor or head injury -breast cancer -cataracts -diabetes -difficulty swallowing -heart disease -kidney disease -liver disease -low blood counts, like low white cell, platelet, or red cell counts -low blood pressure or dizziness when standing up -Parkinson's disease -previous heart attack -seizures -suicidal thoughts, plans, or attempt by you or a family member -thyroid disease -an unusual or allergic reaction to quetiapine, other medicines, foods, dyes, or preservatives -pregnant or trying to get pregnant -breast-feeding How should I use this medicine? Take this medicine by mouth. Swallow it with a drink of water. Follow the directions on the prescription label. If it upsets your stomach you can take it with food. Take your medicine at regular intervals. Do not take it more often than directed. Do not stop taking except on the advice of your doctor or health care professional. A special MedGuide will be given to you by the pharmacist with each prescription and refill. Be sure to read this information carefully each time. Talk to your pediatrician regarding the use of this medicine in children. While this drug may be prescribed for children as young as 10 years for selected conditions, precautions do apply. Patients over age 65 years may have a stronger reaction to this medicine and need smaller doses. Overdosage: If you think you have taken too much of this medicine contact a poison control center or emergency room at once. NOTE: This  medicine is only for you. Do not share this medicine with others. What if I miss a dose? If you miss a dose, take it as soon as you can. If it is almost time for your next dose, take only that dose. Do not take double or extra doses. What may interact with this medicine? Do not take this medicine with any of the following medications: -certain medicines for fungal infections like fluconazole, itraconazole, ketoconazole, posaconazole, voriconazole -cisapride -dofetilide -dronedarone -droperidol -grepafloxacin -halofantrine -phenothiazines like chlorpromazine, mesoridazine, thioridazine -pimozide -sparfloxacin -ziprasidone This medicine may also interact with the following medications: -alcohol -antiviral medicines for HIV or AIDS -certain medicines for blood pressure -certain medicines for depression, anxiety, or psychotic disturbances like haloperidol, lorazepam -certain medicines for diabetes -certain medicines for Parkinson's disease -certain medicines for seizures like carbamazepine, phenobarbital, phenytoin -cimetidine -erythromycin -other medicines that prolong the QT interval (cause an abnormal heart rhythm) -rifampin -steroid medicines like prednisone or cortisone This list may not describe all possible interactions. Give your health care provider a list of all the medicines, herbs, non-prescription drugs, or dietary supplements you use. Also tell them if you smoke, drink alcohol, or use illegal drugs. Some items may interact with your medicine. What should I watch for while using this medicine? Visit your doctor or health care professional for regular checks on your progress. It may be several weeks before you see the full effects of this medicine. Your health care provider may suggest that you have your eyes examined prior to starting this medicine, and every 6 months thereafter. If you have been taking this medicine regularly for some time, do not suddenly stop taking it.  You must gradually   reduce the dose or your symptoms may get worse. Ask your doctor or health care professional for advice. Patients and their families should watch out for worsening depression or thoughts of suicide. Also watch out for sudden or severe changes in feelings such as feeling anxious, agitated, panicky, irritable, hostile, aggressive, impulsive, severely restless, overly excited and hyperactive, or not being able to sleep. If this happens, especially at the beginning of antidepressant treatment or after a change in dose, call your health care professional. You may get dizzy or drowsy. Do not drive, use machinery, or do anything that needs mental alertness until you know how this medicine affects you. Do not stand or sit up quickly, especially if you are an older patient. This reduces the risk of dizzy or fainting spells. Alcohol can increase dizziness and drowsiness. Avoid alcoholic drinks. Do not treat yourself for colds, diarrhea or allergies. Ask your doctor or health care professional for advice, some ingredients may increase possible side effects. This medicine can reduce the response of your body to heat or cold. Dress warm in cold weather and stay hydrated in hot weather. If possible, avoid extreme temperatures like saunas, hot tubs, very hot or cold showers, or activities that can cause dehydration such as vigorous exercise. What side effects may I notice from receiving this medicine? Side effects that you should report to your doctor or health care professional as soon as possible: -allergic reactions like skin rash, itching or hives, swelling of the face, lips, or tongue -difficulty swallowing -fast or irregular heartbeat -fever or chills, sore throat -fever with rash, swollen lymph nodes, or swelling of the face -increased hunger or thirst -increased urination -problems with balance, talking, walking -seizures -stiff muscles -suicidal thoughts or other mood  changes -uncontrollable head, mouth, neck, arm, or leg movements -unusually weak or tired Side effects that usually do not require medical attention (report to your doctor or health care professional if they continue or are bothersome): -change in sex drive or performance -constipation -drowsy or dizzy -dry mouth -stomach upset -weight gain This list may not describe all possible side effects. Call your doctor for medical advice about side effects. You may report side effects to FDA at 1-800-FDA-1088. Where should I keep my medicine? Keep out of the reach of children. Store at room temperature between 15 and 30 degrees C (59 and 86 degrees F). Throw away any unused medicine after the expiration date. NOTE: This sheet is a summary. It may not cover all possible information. If you have questions about this medicine, talk to your doctor, pharmacist, or health care provider.  2018 Elsevier/Gold Standard (2014-08-12 13:07:35)  

## 2017-10-16 ENCOUNTER — Telehealth (HOSPITAL_COMMUNITY): Payer: Self-pay | Admitting: Psychiatry

## 2017-10-16 NOTE — Telephone Encounter (Signed)
D:  Dr. Elna BreslowEappen referred pt to [facility].  A:  Placed call to pt.  Pt states she already made an appt with Felecia Janina Thompson, LCSW.  Encouraged pt to call writer later if she decides she would like to try group.  R:  Pt receptive.

## 2017-10-19 ENCOUNTER — Other Ambulatory Visit: Payer: Self-pay | Admitting: Psychiatry

## 2017-10-19 DIAGNOSIS — F331 Major depressive disorder, recurrent, moderate: Secondary | ICD-10-CM

## 2017-10-20 ENCOUNTER — Ambulatory Visit: Payer: BLUE CROSS/BLUE SHIELD | Admitting: Psychiatry

## 2017-10-24 ENCOUNTER — Other Ambulatory Visit: Payer: Self-pay | Admitting: Psychiatry

## 2017-10-24 DIAGNOSIS — F419 Anxiety disorder, unspecified: Secondary | ICD-10-CM

## 2017-10-24 DIAGNOSIS — F5105 Insomnia due to other mental disorder: Secondary | ICD-10-CM

## 2017-10-24 DIAGNOSIS — F331 Major depressive disorder, recurrent, moderate: Secondary | ICD-10-CM

## 2017-10-25 ENCOUNTER — Other Ambulatory Visit: Payer: Self-pay | Admitting: Psychiatry

## 2017-10-25 DIAGNOSIS — F331 Major depressive disorder, recurrent, moderate: Secondary | ICD-10-CM

## 2017-10-25 DIAGNOSIS — F5105 Insomnia due to other mental disorder: Secondary | ICD-10-CM

## 2017-10-25 DIAGNOSIS — F419 Anxiety disorder, unspecified: Secondary | ICD-10-CM

## 2017-12-10 ENCOUNTER — Other Ambulatory Visit: Payer: Self-pay | Admitting: Psychiatry

## 2017-12-10 DIAGNOSIS — F419 Anxiety disorder, unspecified: Secondary | ICD-10-CM

## 2017-12-10 DIAGNOSIS — F331 Major depressive disorder, recurrent, moderate: Secondary | ICD-10-CM

## 2017-12-13 ENCOUNTER — Telehealth: Payer: Self-pay

## 2017-12-13 DIAGNOSIS — F419 Anxiety disorder, unspecified: Secondary | ICD-10-CM

## 2017-12-13 DIAGNOSIS — F331 Major depressive disorder, recurrent, moderate: Secondary | ICD-10-CM

## 2017-12-13 MED ORDER — DESVENLAFAXINE SUCCINATE ER 25 MG PO TB24: 75.0000 mg | ORAL_TABLET | Freq: Every day | ORAL | 0 refills | Status: DC

## 2017-12-13 NOTE — Telephone Encounter (Signed)
pt called states she needs just enough medications to get to her next appt on  monday

## 2017-12-13 NOTE — Telephone Encounter (Signed)
I have sent pristiq to pharmacy

## 2017-12-18 ENCOUNTER — Ambulatory Visit: Payer: BLUE CROSS/BLUE SHIELD | Admitting: Psychiatry

## 2017-12-18 ENCOUNTER — Encounter: Payer: Self-pay | Admitting: Psychiatry

## 2017-12-18 ENCOUNTER — Other Ambulatory Visit: Payer: Self-pay

## 2017-12-18 VITALS — BP 129/83 | HR 68 | Temp 97.8°F | Wt 170.8 lb

## 2017-12-18 DIAGNOSIS — F339 Major depressive disorder, recurrent, unspecified: Secondary | ICD-10-CM

## 2017-12-18 DIAGNOSIS — G4701 Insomnia due to medical condition: Secondary | ICD-10-CM | POA: Diagnosis not present

## 2017-12-18 MED ORDER — DESVENLAFAXINE SUCCINATE ER 25 MG PO TB24: 75.0000 mg | ORAL_TABLET | Freq: Every day | ORAL | 1 refills | Status: DC

## 2017-12-18 MED ORDER — QUETIAPINE FUMARATE 25 MG PO TABS: 37.5000 mg | ORAL_TABLET | Freq: Every day | ORAL | 1 refills | Status: DC

## 2017-12-18 NOTE — Progress Notes (Signed)
[facility] MD OP Progress Note  12/18/2017 12:54 PM Susan Garner  MRN:  [id number]  Chief Complaint: ' I am here for follow up.' Chief Complaint    Follow-up; Medication Refill     HPI: Susan Garner is a 47 year old Caucasian female, single, lives in [place], employed, has a history of depression, insomnia, presented to the clinic today for a follow-up visit.  Patient reports she is tolerating the Pristiq well.  She reports increasing the Pristiq dosage made a big difference in her mood symptoms.  She does not feel as overwhelmed as she used to before.  She reports her depressive symptoms as improving.  She continues to struggle with sleep.  She reports that may be due to her pain.  Patient reports she has back pain that radiates down to her thighs.  She reports she will reach out to her primary medical doctor as soon as possible.  She has tried gabapentin before which she did not like.  She reports she takes her Seroquel only some nights.  She however reports she would like her dosage to be increased today.  She denies any side effects to the Seroquel.  Patient reports she does not have any other concerns at this time.  She denies any suicidality.  She denies any perceptual disturbances.   Visit Diagnosis:    ICD-10-CM   1. 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 Insomnia due to medical condition G47.01 QUEtiapine (SEROQUEL) 25 MG tablet   pain    Past Psychiatric History: Reviewed past psychiatric history from my progress note on 04/25/2017.  Past trials of Wellbutrin, Celexa, Xanax, Elavil, Prozac, Lexapro, Cymbalta, Effexor.  Past Medical History:  Past Medical History:  Diagnosis Date  . Anxiety   . Depression   . Headache   . Kidney stones   . Ovarian cyst     Past Surgical History:  Procedure Laterality Date  . FEMUR FRACTURE SURGERY  Right 1980's  . TONSILLECTOMY    . TUBAL LIGATION      Family Psychiatric History: Reviewed family psychiatric history from my progress note on 04/25/2017.  Family History:  Family History  Problem Relation Age of Onset  . Anxiety disorder Mother   . Cancer Neg Hx   . Diabetes Neg Hx   . Heart disease Neg Hx     Social History: Reviewed social history from my progress note on 04/25/2017 Social History   Socioeconomic History  . Marital status: Single    Spouse name: Not on file  . Number of children: Not on file  . Years of education: Not on file  . Highest education level: Not on file  Occupational History  . Not on file  Social Needs  . Financial resource strain: Not on file  . Food insecurity:    Worry: Not on file    Inability: Not on file  . Transportation needs:    Medical: Not on file    Non-medical: Not on file  Tobacco Use  . Smoking status: Never Smoker  . Smokeless tobacco: Never Used  Substance and Sexual Activity  . Alcohol use: No    Alcohol/week: 0.0 standard drinks  . Drug use: No  . Sexual activity: Not Currently    Birth control/protection: Surgical  Lifestyle  . Physical activity:    Days per week: Not on file    Minutes  per session: Not on file  . Stress: Not on file  Relationships  . Social connections:    Talks on phone: Not on file    Gets together: Not on file    Attends religious service: Not on file    Active member of club or organization: Not on file    Attends meetings of clubs or organizations: Not on file    Relationship status: Not on file  Other Topics Concern  . Not on file  Social History Narrative  . Not on file    Allergies: No Known Allergies  Metabolic Disorder Labs: Lab Results  Component Value Date   HGBA1C 5.3 11/25/2014   No results found for: PROLACTIN No results found for: CHOL, TRIG, HDL, CHOLHDL, VLDL, LDLCALC Lab Results  Component Value Date   TSH 1.370 11/25/2014    Therapeutic Level Labs: No  results found for: LITHIUM No results found for: VALPROATE No components found for:  CBMZ  Current Medications: Current Outpatient Medications  Medication Sig Dispense Refill  . Desvenlafaxine Succinate ER (PRISTIQ) 25 MG TB24 Take 75 mg by mouth daily. 90 tablet 1  . propranolol (INDERAL) 10 MG tablet TAKE 1 TABLET (10 MG TOTAL) BY MOUTH 3 (THREE) TIMES DAILY AS NEEDED FOR ANXIETY 270 tablet 1  . QUEtiapine (SEROQUEL) 25 MG tablet Take 1.5 tablets (37.5 mg total) by mouth at bedtime. For mood and sleep 45 tablet 1  . valACYclovir (VALTREX) 1000 MG tablet Take 1 tablet (1,000 mg total) by mouth 2 (two) times daily. 60 tablet 2   No current facility-administered medications for this visit.      Musculoskeletal: Strength & Muscle Tone: within normal limits Gait & Station: normal Patient leans: N/A  Psychiatric Specialty Exam: Review of Systems  Psychiatric/Behavioral: Positive for depression (improved). The patient is nervous/anxious (improved) and has insomnia.   All other systems reviewed and are negative.   Blood pressure 129/83, pulse 68, temperature 97.8 F (36.6 C), temperature source Oral, weight 170 lb 12.8 oz (77.5 kg).Body mass index is 25.22 kg/m.  General Appearance: Casual  Eye Contact:  Fair  Speech:  Clear and Coherent  Volume:  Normal  Mood:  Dysphoric  Affect:  Congruent  Thought Process:  Goal Directed and Descriptions of Associations: Intact  Orientation:  Full (Time, Place, and Person)  Thought Content: Logical   Suicidal Thoughts:  No  Homicidal Thoughts:  No  Memory:  Immediate;   Fair Recent;   Fair Remote;   Fair  Judgement:  Fair  Insight:  Fair  Psychomotor Activity:  Normal  Concentration:  Concentration: Fair and Attention Span: Fair  Recall:  [employer] of Knowledge: Fair  Language: Fair  Akathisia:  No  Handed:  Right  AIMS (if indicated): denies tremors, stiffness, rigidity.  Assets:  Communication Skills Desire for  Improvement Social Support  ADL's:  Intact  Cognition: WNL  Sleep:  Poor   Screenings: AIMS     Office Visit from 04/25/2016 in [facility]  AIMS Total Score  0    PHQ2-9     Office Visit from 05/12/2015 in [facility] Office Visit from 04/16/2015 in [facility] Office Visit from 11/25/2014 in [facility]  PHQ-2 Total Score  3  3  3   PHQ-9 Total Score  10  12  16        Assessment and Plan: Myeshia is a 47 yr old Caucasian female who has a history  of depression, anxiety, presented to the clinic today for a follow-up visit.  Patient reports her mood symptoms is improving however continues to struggle with sleep.  She also has pain which is contributing to her restlessness at night.  She continues to have psychosocial stressors however reports she is better able to cope.  We will continue medications as noted below.  Plan For depression Pristiq 75 mg p.o. daily Increase Seroquel to 37.5 mg p.o. Nightly Propranolol 10 mg p.o. 3 times daily as needed for severe anxiety symptoms. PHQ 9 - 5  For insomnia Seroquel 37.5 mg p.o. nightly   She has been referred to Ms. Felecia Jan for CBT.  She has not actually started therapy sessions yet.  Follow-up in clinic in 1 month or sooner if needed.  More than 50 % of the time was spent for psychoeducation and supportive psychotherapy and care coordination.  This note was generated in part or whole with voice recognition software. Voice recognition is usually quite accurate but there are transcription errors that can and very often do occur. I apologize for any typographical errors that were not detected and corrected.         Jomarie Longs, MD 12/18/2017, 12:54 PM

## 2018-01-08 ENCOUNTER — Other Ambulatory Visit: Payer: Self-pay | Admitting: Psychiatry

## 2018-01-08 DIAGNOSIS — F331 Major depressive disorder, recurrent, moderate: Secondary | ICD-10-CM

## 2018-01-08 DIAGNOSIS — F419 Anxiety disorder, unspecified: Secondary | ICD-10-CM

## 2018-02-01 ENCOUNTER — Emergency Department: Payer: BLUE CROSS/BLUE SHIELD

## 2018-02-01 ENCOUNTER — Emergency Department
Admission: EM | Admit: 2018-02-01 | Discharge: 2018-02-01 | Disposition: A | Discharge status: Home / Self Care | Payer: BLUE CROSS/BLUE SHIELD | Attending: Emergency Medicine | Admitting: Emergency Medicine

## 2018-02-01 ENCOUNTER — Encounter: Payer: Self-pay | Admitting: Emergency Medicine

## 2018-02-01 ENCOUNTER — Other Ambulatory Visit: Payer: Self-pay

## 2018-02-01 DIAGNOSIS — Z79899 Other long term (current) drug therapy: Secondary | ICD-10-CM | POA: Insufficient documentation

## 2018-02-01 DIAGNOSIS — R51 Headache: Secondary | ICD-10-CM | POA: Insufficient documentation

## 2018-02-01 DIAGNOSIS — R519 Headache, unspecified: Secondary | ICD-10-CM

## 2018-02-01 LAB — CBC WITH DIFFERENTIAL/PLATELET
Abs Immature Granulocytes: 0.02 10*3/uL (ref 0.00–0.07)
BASOS ABS: 0 10*3/uL (ref 0.0–0.1)
Basophils Relative: 1 %
Eosinophils Absolute: 0.1 10*3/uL (ref 0.0–0.5)
Eosinophils Relative: 1 %
HCT: 38.3 % (ref 36.0–46.0)
Hemoglobin: 12.6 g/dL (ref 12.0–15.0)
Immature Granulocytes: 0 %
LYMPHS ABS: 1.6 10*3/uL (ref 0.7–4.0)
LYMPHS PCT: 25 %
MCH: 30.8 pg (ref 26.0–34.0)
MCHC: 32.9 g/dL (ref 30.0–36.0)
MCV: 93.6 fL (ref 80.0–100.0)
MONO ABS: 0.6 10*3/uL (ref 0.1–1.0)
MONOS PCT: 9 %
Neutro Abs: 4 10*3/uL (ref 1.7–7.7)
Neutrophils Relative %: 64 %
PLATELETS: 289 10*3/uL (ref 150–400)
RBC: 4.09 MIL/uL (ref 3.87–5.11)
RDW: 12.2 % (ref 11.5–15.5)
WBC: 6.3 10*3/uL (ref 4.0–10.5)
nRBC: 0 % (ref 0.0–0.2)

## 2018-02-01 LAB — BASIC METABOLIC PANEL
ANION GAP: 6 (ref 5–15)
BUN: 10 mg/dL (ref 6–20)
CALCIUM: 9 mg/dL (ref 8.9–10.3)
CO2: 23 mmol/L (ref 22–32)
CREATININE: 0.76 mg/dL (ref 0.44–1.00)
Chloride: 108 mmol/L (ref 98–111)
GLUCOSE: 97 mg/dL (ref 70–99)
POTASSIUM: 3.7 mmol/L (ref 3.5–5.1)
Sodium: 137 mmol/L (ref 135–145)

## 2018-02-01 MED ORDER — SODIUM CHLORIDE 0.9 % IV BOLUS
1000.0000 mL | Freq: Once | INTRAVENOUS | Status: AC
  Administered 2018-02-01: 1000 mL via INTRAVENOUS

## 2018-02-01 MED ORDER — ONDANSETRON 4 MG PO TBDP: 4.0000 mg | ORAL_TABLET | Freq: Three times a day (TID) | ORAL | 0 refills | Status: DC | PRN

## 2018-02-01 MED ORDER — KETOROLAC TROMETHAMINE 30 MG/ML IJ SOLN
30.0000 mg | Freq: Once | INTRAMUSCULAR | Status: AC
  Administered 2018-02-01: 30 mg via INTRAVENOUS
  Filled 2018-02-01: qty 1

## 2018-02-01 MED ORDER — ONDANSETRON HCL 4 MG/2ML IJ SOLN
4.0000 mg | Freq: Once | INTRAMUSCULAR | Status: AC
Start: 2018-02-01 — End: 2018-02-01
  Administered 2018-02-01: 4 mg via INTRAVENOUS
  Filled 2018-02-01: qty 2

## 2018-02-01 MED ORDER — ORPHENADRINE CITRATE 30 MG/ML IJ SOLN
60.0000 mg | INTRAMUSCULAR | Status: AC
  Administered 2018-02-01: 60 mg via INTRAVENOUS
  Filled 2018-02-01: qty 2

## 2018-02-01 MED ORDER — CYCLOBENZAPRINE HCL 5 MG PO TABS: 5.0000 mg | ORAL_TABLET | Freq: Three times a day (TID) | ORAL | 0 refills | Status: AC | PRN

## 2018-02-01 MED ORDER — DIPHENHYDRAMINE HCL 50 MG/ML IJ SOLN
25.0000 mg | Freq: Once | INTRAMUSCULAR | Status: AC
  Administered 2018-02-01: 25 mg via INTRAVENOUS
  Filled 2018-02-01: qty 1

## 2018-02-01 MED ORDER — KETOROLAC TROMETHAMINE 10 MG PO TABS: 10.0000 mg | ORAL_TABLET | Freq: Three times a day (TID) | ORAL | 0 refills | Status: DC

## 2018-02-01 NOTE — ED Notes (Signed)
Patient c/o headache that started yesterday at work followed by nausea. Light and noise sensitivity

## 2018-02-01 NOTE — Discharge Instructions (Addendum)
Your exam, labs, and CT scan are all reassuring following your evaluation for headache. You have been treated with a several medicines to reverse your headache. Take the prescription meds as directed. Drink plenty of fluids and rest as needed. Follow-up with your provider for ongoing symptoms. Return as needed.

## 2018-02-01 NOTE — ED Provider Notes (Signed)
[facility] Emergency Department Provider Note ____________________________________________  Time seen: 1546  I have reviewed the triage vital signs and the nursing notes.  HISTORY  Chief Complaint  Headache and Nausea  HPI Susan Garner is a 47 y.o. female who presents to the ED accompanied by her family member for evaluation of a 2-day complaint of persistent intense headache.  Patient describes the onset of the headache yesterday after she reported to work.  She denies any prodrome prior to the onset of the headache.  She describes a frontal headache with some extension to the neck bilaterally.  She denies any fevers, chills, or sweats.  She also denies any recent illness, sinus congestion, or vomiting.  She has experienced nausea as well as dry heaving with the headache.  Patient describes the headache has been at a 9 or 10 since the onset, with little benefit with taking over-the-counter extra strength Tylenol.  She reported to her primary care provider this morning for management of a headache, and they referred her to the ED for further evaluation.  Patient gives a headache history and a frequency of about every 2 months.  She describes the headaches are usually resolve spontaneously after she sleeps through the headache.  Rarely do headaches persist overnight.  She denies any visual disturbance but does note some light sensitivity.  She is not experiencing any distal paresthesias, chest pain, shortness of breath, weakness, syncope, vertigo, tinnitus, or hearing loss.  Patient is also denied any preceding trauma, accidents, or falls.  Seen by her primary provider back in September, and was given a prescription for propranolol to take as needed for anxiety.  Patient describes she did not fill the prescription as it was provided.  Past Medical History:  Diagnosis Date  . Anxiety   . Depression   . Headache   . Kidney stones   . Ovarian cyst     Patient  Active Problem List   Diagnosis Date Noted  . Depression, major, recurrent, moderate (HCC) 07/24/2015  . Kidney stone on right side 07/22/2015  . Right flank pain 07/22/2015  . Cyst of right ovary 07/22/2015  . Anxiety 11/25/2014  . Depression 11/25/2014  . Fatigue 11/25/2014  . Right ear pain 11/25/2014  . 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 Displacement of lumbar intervertebral disc without myelopathy 07/23/2013  . Bulge of lumbar disc without myelopathy 07/23/2013    Past Surgical History:  Procedure Laterality Date  . FEMUR FRACTURE SURGERY Right 1980's  . TONSILLECTOMY    . TUBAL LIGATION      Prior to Admission medications   Medication Sig Start Date End Date Taking? Authorizing Provider  cyclobenzaprine (FLEXERIL) 5 MG tablet Take 1 tablet (5 mg total) by mouth 3 (three) times daily as needed for up to 3 days for muscle spasms. 02/01/18 02/04/18  Kynlie Jane, Charlesetta Ivory, PA-C  Desvenlafaxine Succinate ER (PRISTIQ) 25 MG TB24 Take 75 mg by mouth daily. 12/18/17   Jomarie Longs, MD  ketorolac (TORADOL) 10 MG tablet Take 1 tablet (10 mg total) by mouth every 8 (eight) hours. 02/01/18   Shamir Tuzzolino, Charlesetta Ivory, PA-C  ondansetron (ZOFRAN ODT) 4 MG disintegrating tablet Take 1 tablet (4 mg total) by mouth every 8 (eight) hours as needed. 02/01/18   Greycen Felter, Charlesetta Ivory, PA-C  propranolol (INDERAL) 10 MG tablet TAKE 1 TABLET (10 MG TOTAL) BY MOUTH 3 (THREE) TIMES DAILY AS NEEDED FOR ANXIETY 10/26/17  Jomarie Longs, MD  QUEtiapine (SEROQUEL) 25 MG tablet Take 1.5 tablets (37.5 mg total) by mouth at bedtime. For mood and sleep 12/18/17   Jomarie Longs, MD  valACYclovir (VALTREX) 1000 MG tablet Take 1 tablet (1,000 mg total) by mouth 2 (two) times daily. 11/23/16   Purcell Nails, CNM    Allergies Patient has no known allergies.  Family History  Problem Relation Age of Onset  . Anxiety disorder Mother   . Cancer Neg  Hx   . Diabetes Neg Hx   . Heart disease Neg Hx     Social History Social History   Tobacco Use  . Smoking status: Never Smoker  . Smokeless tobacco: Never Used  Substance Use Topics  . Alcohol use: No    Alcohol/week: 0.0 standard drinks  . Drug use: No    Review of Systems  Constitutional: Negative for fever. Eyes: Negative for visual changes. Reports photosensitivity ENT: Negative for sore throat or rhinorrhea Cardiovascular: Negative for chest pain. Respiratory: Negative for shortness of breath. Gastrointestinal: Negative for abdominal pain, vomiting and diarrhea. Nausea and dry heaves Genitourinary: Negative for dysuria. Musculoskeletal: Negative for back pain. Skin: Negative for rash. Neurological: Positive for headaches, Negative for focal weakness or numbness. ____________________________________________  PHYSICAL EXAM:  VITAL SIGNS: ED Triage Vitals  Enc Vitals Group     BP 02/01/18 1433 136/67     Pulse Rate 02/01/18 1433 77     Resp 02/01/18 1433 16     Temp 02/01/18 1433 98.1 F (36.7 C)     Temp Source 02/01/18 1433 Oral     SpO2 02/01/18 1433 99 %     Weight 02/01/18 1434 165 lb (74.8 kg)     Height 02/01/18 1434 5\' 8"  (1.727 m)     Head Circumference --      Peak Flow --      Pain Score 02/01/18 1434 10     Pain Loc --      Pain Edu? --      Excl. in GC? --     Constitutional: Alert and oriented. Well appearing and in no distress. Head: Normocephalic and atraumatic. Eyes: Conjunctivae are normal. PERRL. Normal extraocular movements and fundi bilaterally. Ears: Canals clear. TMs intact bilaterally. Nose: No congestion/rhinorrhea/epistaxis. Mouth/Throat: Mucous membranes are moist. Neck: Supple. Normal ROM without crepitus. No distracting midline tenderness or nuchal rigidity. Cardiovascular: Normal rate, regular rhythm. Normal distal pulses. Respiratory: Normal respiratory effort. No wheezes/rales/rhonchi. Gastrointestinal: Soft and  nontender. No distention. Musculoskeletal: Nontender with normal range of motion in all extremities.  Neurologic:  CN II-XII grossly intact. Normal gait without ataxia. Normal speech and language. No gross focal neurologic deficits are appreciated. Skin:  Skin is warm, dry and intact. No rash noted. Psychiatric: Mood and affect are normal. Patient exhibits appropriate insight and judgment. ____________________________________________   LABS (pertinent positives/negatives) Labs Reviewed  CBC WITH DIFFERENTIAL/PLATELET  BASIC METABOLIC PANEL  ____________________________________________   RADIOLOGY  CT Head w/o CM IMPRESSION: Normal noncontrast head CT. ____________________________________________  PROCEDURES  Procedures NS 1000 ml bolus Zofran 4 mg IVP Benadryl 25 mg IVP Toradol 30 mg IVP Norflex 60 mg IVP ____________________________________________  INITIAL IMPRESSION / ASSESSMENT AND PLAN / ED COURSE  Patient with ED evaluation of a persistent headache. She has a history of headaches, that usually resolve without intervention. She reports near resolution of her headache pain at the time of discharge, noting pain at 1/10. She is discharged with prescriptions for Zofran, Toradol, and Flexeril. She will  follow-up with her PCP or return as needed.  ____________________________________________  FINAL CLINICAL IMPRESSION(S) / ED DIAGNOSES  Final diagnoses:  Bad headache      Karmen StabsMenshew, Charlesetta IvoryJenise V Bacon, PA-C 02/01/18 1745    Rockne MenghiniNorman, Anne-Caroline, MD 02/01/18 2322

## 2018-02-01 NOTE — ED Triage Notes (Signed)
Pt to ED via POV from [facility] c/o headache since yesterday morning. Pt states that she is also having nausea. Pt states that she has been dry heaving but nothing is coming up because she not been able to eat. Pt denies hx/o migraines. Pt states that she does have photosensitivity. Pt is in NAD at this time.

## 2018-02-07 ENCOUNTER — Other Ambulatory Visit: Payer: Self-pay | Admitting: Obstetrics and Gynecology

## 2018-02-07 DIAGNOSIS — Z1231 Encounter for screening mammogram for malignant neoplasm of breast: Secondary | ICD-10-CM

## 2018-02-08 ENCOUNTER — Other Ambulatory Visit: Payer: Self-pay | Admitting: Psychiatry

## 2018-02-08 DIAGNOSIS — F339 Major depressive disorder, recurrent, unspecified: Secondary | ICD-10-CM

## 2018-02-26 ENCOUNTER — Ambulatory Visit
Admission: RE | Admit: 2018-02-26 | Discharge: 2018-02-26 | Disposition: A | Discharge status: Home / Self Care | Payer: BLUE CROSS/BLUE SHIELD | Source: Ambulatory Visit | Attending: Obstetrics and Gynecology | Admitting: Obstetrics and Gynecology

## 2018-02-26 DIAGNOSIS — Z1231 Encounter for screening mammogram for malignant neoplasm of breast: Secondary | ICD-10-CM | POA: Diagnosis present

## 2018-03-05 ENCOUNTER — Ambulatory Visit: Payer: Self-pay | Admitting: Urology

## 2018-03-05 ENCOUNTER — Encounter: Payer: Self-pay | Admitting: Urology

## 2018-03-20 ENCOUNTER — Other Ambulatory Visit: Payer: Self-pay | Admitting: Psychiatry

## 2018-03-20 DIAGNOSIS — F339 Major depressive disorder, recurrent, unspecified: Secondary | ICD-10-CM

## 2018-03-20 NOTE — Telephone Encounter (Signed)
Pt needs appointment for future refills. Pt was given pristiq  90 days in December.

## 2018-03-21 ENCOUNTER — Other Ambulatory Visit: Payer: Self-pay | Admitting: Psychiatry

## 2018-03-21 DIAGNOSIS — F339 Major depressive disorder, recurrent, unspecified: Secondary | ICD-10-CM

## 2018-03-22 MED ORDER — DESVENLAFAXINE SUCCINATE ER 25 MG PO TB24: 75.0000 mg | ORAL_TABLET | Freq: Every day | ORAL | 0 refills | Status: DC

## 2018-03-22 NOTE — Telephone Encounter (Signed)
prior authorization was done and and pending review  You may have to send in a 50mg  and a 25mg 

## 2018-03-22 NOTE — Telephone Encounter (Signed)
[employer] [place] denied the desvenlafax they will only approve 1 pill a day.  you can try to send a 50 and a 25

## 2018-03-22 NOTE — Telephone Encounter (Signed)
received a fax requesting a prior authorization on medication desvenlafaxine suc er 25mg . it is over the amount given per day. insurance only wants to pay for 1 tablet a day.

## 2018-03-22 NOTE — Telephone Encounter (Signed)
Sent pristiq with clarification to pharmacy.

## 2018-03-23 ENCOUNTER — Telehealth: Payer: Self-pay | Admitting: Psychiatry

## 2018-03-23 DIAGNOSIS — F339 Major depressive disorder, recurrent, unspecified: Secondary | ICD-10-CM

## 2018-03-23 MED ORDER — DESVENLAFAXINE SUCCINATE ER 50 MG PO TB24: 50.0000 mg | ORAL_TABLET | Freq: Every day | ORAL | 0 refills | Status: DC

## 2018-03-23 MED ORDER — DESVENLAFAXINE SUCCINATE ER 25 MG PO TB24: 25.0000 mg | ORAL_TABLET | Freq: Every day | ORAL | 0 refills | Status: DC

## 2018-03-23 NOTE — Telephone Encounter (Signed)
insurance denied rx for 3 x a day needs to be one pill a day. so you can try a 50mg  and a 25mg 

## 2018-03-23 NOTE — Telephone Encounter (Signed)
Sent 50 mg and 25 mg for 30 days of Pristiq - to pharmacy.  Pt needs appointment prior to future refills

## 2018-04-23 ENCOUNTER — Other Ambulatory Visit: Payer: Self-pay | Admitting: Psychiatry

## 2018-04-23 DIAGNOSIS — F339 Major depressive disorder, recurrent, unspecified: Secondary | ICD-10-CM

## 2018-04-25 ENCOUNTER — Other Ambulatory Visit: Payer: Self-pay

## 2018-04-25 ENCOUNTER — Ambulatory Visit (INDEPENDENT_AMBULATORY_CARE_PROVIDER_SITE_OTHER): Payer: BLUE CROSS/BLUE SHIELD | Admitting: Psychiatry

## 2018-04-25 ENCOUNTER — Encounter: Payer: Self-pay | Admitting: Psychiatry

## 2018-04-25 VITALS — BP 97/64 | HR 82 | Temp 98.6°F | Wt 167.8 lb

## 2018-04-25 DIAGNOSIS — F339 Major depressive disorder, recurrent, unspecified: Secondary | ICD-10-CM

## 2018-04-25 DIAGNOSIS — G4701 Insomnia due to medical condition: Secondary | ICD-10-CM

## 2018-04-25 MED ORDER — TRAZODONE HCL 50 MG PO TABS: 25.0000 mg | ORAL_TABLET | Freq: Every evening | ORAL | 2 refills | Status: DC | PRN

## 2018-04-25 MED ORDER — DESVENLAFAXINE SUCCINATE ER 100 MG PO TB24: 100.0000 mg | ORAL_TABLET | Freq: Every day | ORAL | 3 refills | Status: DC

## 2018-04-25 NOTE — Progress Notes (Signed)
[facility] MD OP Progress Note  04/25/2018 4:58 PM Susan Garner  MRN:  [id number]  Chief Complaint: ' I am here for follow up." Chief Complaint    Follow-up     HPI: Susan Garner is a 48 year old Caucasian female, single, lives in [place], employed, has a history of depression, insomnia, presented to clinic today for a follow-up visit.  Patient today reports she is having some anxiety symptoms.  She reports she is changing her job location to [employer].  She reports the place that she works at now is kind of toxic anxious hence she requested this change.  She however reports because of the upcoming change she has been feeling anxious, having some racing thoughts and nervousness recently.  She reports she continues to be compliant with her Pristiq and takes 75 mg now.  Discussed readjusting her dosage and she agrees with plan.  Patient continues to struggle with sleep.  She reports she stopped taking the Seroquel since she did not like the effect.  Discussed adding trazodone.  She agrees with plan.  Patient denies suicidality, homicidality or perceptual disturbances.  Patient denies any other concerns today. Visit Diagnosis:    ICD-10-CM   1. 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 Insomnia due to medical condition G47.01 traZODone (DESYREL) 50 MG tablet    Past Psychiatric History: Reviewed past psychiatric history from my progress note on 04/25/2017.  Past trials of Wellbutrin, Celexa, Xanax, Elavil, Prozac, Lexapro, Cymbalta, Effexor  Past Medical History:  Past Medical History:  Diagnosis Date  . Anxiety   . Depression   . Headache   . Kidney stones   . Ovarian cyst     Past Surgical History:  Procedure Laterality Date  . FEMUR FRACTURE SURGERY Right 1980's  . TONSILLECTOMY    . TUBAL LIGATION      Family Psychiatric History: I have reviewed family  psychiatric history from my progress note on 04/25/2017.  Family History:  Family History  Problem Relation Age of Onset  . Anxiety disorder Mother   . Cancer Neg Hx   . Diabetes Neg Hx   . Heart disease Neg Hx   . Breast cancer Neg Hx     Social History: Reviewed social history from my progress note on 04/25/2017 Social History   Socioeconomic History  . Marital status: Single    Spouse name: Not on file  . Number of children: Not on file  . Years of education: Not on file  . Highest education level: Not on file  Occupational History  . Not on file  Social Needs  . Financial resource strain: Not on file  . Food insecurity:    Worry: Not on file    Inability: Not on file  . Transportation needs:    Medical: Not on file    Non-medical: Not on file  Tobacco Use  . Smoking status: Never Smoker  . Smokeless tobacco: Never Used  Substance and Sexual Activity  . Alcohol use: No    Alcohol/week: 0.0 standard drinks  . Drug use: No  . Sexual activity: Not Currently    Birth control/protection: Surgical  Lifestyle  . Physical activity:    Days per week: Not on file    Minutes per session: Not on file  . Stress: Not on file  Relationships  . Social connections:    Talks on phone: Not on  file    Gets together: Not on file    Attends religious service: Not on file    Active member of club or organization: Not on file    Attends meetings of clubs or organizations: Not on file    Relationship status: Not on file  Other Topics Concern  . Not on file  Social History Narrative  . Not on file    Allergies: No Known Allergies  Metabolic Disorder Labs: Lab Results  Component Value Date   HGBA1C 5.3 11/25/2014   No results found for: PROLACTIN No results found for: CHOL, TRIG, HDL, CHOLHDL, VLDL, LDLCALC Lab Results  Component Value Date   TSH 1.370 11/25/2014    Therapeutic Level Labs: No results found for: LITHIUM No results found for: VALPROATE No components  found for:  CBMZ  Current Medications: Current Outpatient Medications  Medication Sig Dispense Refill  . valACYclovir (VALTREX) 1000 MG tablet Take 1 tablet (1,000 mg total) by mouth 2 (two) times daily. 60 tablet 2  . desvenlafaxine (PRISTIQ) 100 MG 24 hr tablet Take 1 tablet (100 mg total) by mouth at bedtime. 30 tablet 3  . traZODone (DESYREL) 50 MG tablet Take 0.5-1 tablets (25-50 mg total) by mouth at bedtime as needed for sleep. 30 tablet 2   No current facility-administered medications for this visit.      Musculoskeletal: Strength & Muscle Tone: within normal limits Gait & Station: normal Patient leans: N/A  Psychiatric Specialty Exam: Review of Systems  Psychiatric/Behavioral: The patient is nervous/anxious and has insomnia.   All other systems reviewed and are negative.   Blood pressure 97/64, pulse 82, temperature 98.6 F (37 C), temperature source Oral, weight 167 lb 12.8 oz (76.1 kg).Body mass index is 25.51 kg/m.  General Appearance: Casual  Eye Contact:  Fair  Speech:  Clear and Coherent  Volume:  Normal  Mood:  Anxious  Affect:  Congruent  Thought Process:  Goal Directed and Descriptions of Associations: Intact  Orientation:  Full (Time, Place, and Person)  Thought Content: Logical   Suicidal Thoughts:  No  Homicidal Thoughts:  No  Memory:  Immediate;   Fair Recent;   Fair Remote;   Fair  Judgement:  Fair  Insight:  Fair  Psychomotor Activity:  Normal  Concentration:  Concentration: Fair and Attention Span: Fair  Recall:  [employer] of Knowledge: Fair  Language: Fair  Akathisia:  No  Handed:  Right  AIMS (if indicated):denies tremors, rigidity  Assets:  Communication Skills Desire for Improvement Social Support  ADL's:  Intact  Cognition: WNL  Sleep:  Poor   Screenings: AIMS     [facility] from 04/25/2016 in [facility]  AIMS Total Score  0    PHQ2-9     Office Visit from 05/12/2015 in [facility] Office Visit from 04/16/2015 in [facility] Office Visit from 11/25/2014 in [facility]  PHQ-2 Total Score  PHQ-9 Total Score  Assessment and Plan: Jammi is a 48 year old Caucasian female who has a history of depression, anxiety, presented to clinic today for a follow-up visit.  Today reports that she continues to struggle with some depressive and anxiety symptoms.  We will continue to make medication readjustment.  Plan as noted below.  Plan For depression- unstable Increase Pristiq to 100 mg p.o. nightly Discontinue Seroquel for noncompliance Discontinue propranolol  for noncompliance.  New  For insomnia-unstable Discontinue Seroquel for noncompliance. Start trazodone 25 to 50 mg p.o. nightly as needed  Follow-up in clinic in 2 months or sooner if needed.  I have spent atleast 15 minutes face to face with patient today. More than 50 % of the time was spent for psychoeducation and supportive psychotherapy and care coordination.  This note was generated in part or whole with voice recognition software. Voice recognition is usually quite accurate but there are transcription errors that can and very often do occur. I apologize for any typographical errors that were not detected and corrected.        Jomarie Longs, MD 04/25/2018, 4:58 PM

## 2018-05-07 ENCOUNTER — Other Ambulatory Visit: Payer: Self-pay | Admitting: Psychiatry

## 2018-05-07 DIAGNOSIS — F339 Major depressive disorder, recurrent, unspecified: Secondary | ICD-10-CM

## 2018-05-09 ENCOUNTER — Other Ambulatory Visit: Payer: Self-pay | Admitting: Obstetrics and Gynecology

## 2018-05-09 DIAGNOSIS — Z1231 Encounter for screening mammogram for malignant neoplasm of breast: Secondary | ICD-10-CM

## 2018-06-27 ENCOUNTER — Ambulatory Visit: Payer: BLUE CROSS/BLUE SHIELD | Admitting: Psychiatry

## 2018-07-25 ENCOUNTER — Telehealth: Payer: Self-pay | Admitting: Urology

## 2018-07-25 NOTE — Telephone Encounter (Signed)
Pt saw Apolinar Junes 07/2015 and made an appt for 08/2018.  She wants to know if her pee smells like ammonia, if this is normal.

## 2018-07-26 NOTE — Telephone Encounter (Signed)
Unable to reach patient.

## 2018-08-19 ENCOUNTER — Other Ambulatory Visit: Payer: Self-pay | Admitting: Psychiatry

## 2018-08-19 DIAGNOSIS — F339 Major depressive disorder, recurrent, unspecified: Secondary | ICD-10-CM

## 2018-08-29 ENCOUNTER — Encounter: Payer: Self-pay | Admitting: Urology

## 2018-08-29 ENCOUNTER — Ambulatory Visit: Payer: BLUE CROSS/BLUE SHIELD | Admitting: Urology

## 2018-09-13 ENCOUNTER — Other Ambulatory Visit: Payer: Self-pay | Admitting: Psychiatry

## 2018-09-13 DIAGNOSIS — F339 Major depressive disorder, recurrent, unspecified: Secondary | ICD-10-CM

## 2018-09-18 MED ORDER — DESVENLAFAXINE SUCCINATE ER 100 MG PO TB24: ORAL_TABLET | ORAL | 0 refills | Status: DC

## 2018-09-18 NOTE — Telephone Encounter (Signed)
Patient needs appointment prior to next refill.  

## 2018-09-25 ENCOUNTER — Other Ambulatory Visit: Payer: Self-pay

## 2018-09-25 DIAGNOSIS — Z20822 Contact with and (suspected) exposure to covid-19: Secondary | ICD-10-CM

## 2018-09-26 LAB — NOVEL CORONAVIRUS, NAA: SARS-CoV-2, NAA: NOT DETECTED

## 2019-02-20 ENCOUNTER — Encounter: Payer: Self-pay | Admitting: Psychiatry

## 2019-02-20 ENCOUNTER — Ambulatory Visit (INDEPENDENT_AMBULATORY_CARE_PROVIDER_SITE_OTHER): Payer: BC Managed Care – PPO | Admitting: Psychiatry

## 2019-02-20 ENCOUNTER — Other Ambulatory Visit: Payer: Self-pay

## 2019-02-20 DIAGNOSIS — Z9111 Patient's noncompliance with dietary regimen: Secondary | ICD-10-CM | POA: Diagnosis not present

## 2019-02-20 DIAGNOSIS — F339 Major depressive disorder, recurrent, unspecified: Secondary | ICD-10-CM | POA: Insufficient documentation

## 2019-02-20 DIAGNOSIS — Z79899 Other long term (current) drug therapy: Secondary | ICD-10-CM | POA: Insufficient documentation

## 2019-02-20 DIAGNOSIS — G4701 Insomnia due to medical condition: Secondary | ICD-10-CM | POA: Diagnosis not present

## 2019-02-20 DIAGNOSIS — Z91199 Patient's noncompliance with other medical treatment and regimen due to unspecified reason: Secondary | ICD-10-CM | POA: Insufficient documentation

## 2019-02-20 MED ORDER — DESVENLAFAXINE SUCCINATE ER 25 MG PO TB24: 25.0000 mg | ORAL_TABLET | Freq: Every evening | ORAL | 0 refills | Status: DC

## 2019-02-20 MED ORDER — NORTRIPTYLINE HCL 25 MG PO CAPS: 25.0000 mg | ORAL_CAPSULE | Freq: Every day | ORAL | 1 refills | Status: DC

## 2019-02-20 NOTE — Progress Notes (Signed)
Virtual Visit via Video Note  I connected with Susan Garner on 02/20/19 at  2:15 PM EST by a video enabled telemedicine application and verified that I am speaking with the correct person using two identifiers.   I discussed the limitations of evaluation and management by telemedicine and the availability of in person appointments. The patient expressed understanding and agreed to proceed.     I discussed the assessment and treatment plan with the patient. The patient was provided an opportunity to ask questions and all were answered. The patient agreed with the plan and demonstrated an understanding of the instructions.   The patient was advised to call back or seek an in-person evaluation if the symptoms worsen or if the condition fails to improve as anticipated.   [place] MD OP Progress Note  02/20/2019 6:15 PM Susan Garner  MRN:  [id number]  Chief Complaint:  Chief Complaint    Follow-up     HPI: Susan Garner is a 48 year old Caucasian female, single, lives in [place], employed, has a history of depression, insomnia, was evaluated by telemedicine today.  Patient was last seen on 04/25/2018.  Patient has been noncompliant with follow-up recommendations.  Patient also has been noncompliant with some of her medications as prescribed.  She however reports she continues to take Pristiq since she had refills available.  Patient today reports she is currently struggling with sadness, lack of motivation, low energy, increased appetite, excessive sleep whenever she can and restless sleep on and off.  She also reports a lot of racing thoughts, worrying about different things.  She reports she feels as though she is unable to focus on her work often.  Patient reports the Pristiq is not helpful.  She reports she tried trazodone when it was prescribed to her and she does not think it helped her to sleep through the night and she felt unrested when she woke up in the morning.  Patient  denies any suicidality, homicidality or perceptual disturbances.     Visit Diagnosis:    ICD-10-CM   1. Major depressive disorder, recurrent episode with anxious distress (HCC)  F33.9 TSH    nortriptyline (PAMELOR) 25 MG capsule    Desvenlafaxine Succinate ER (PRISTIQ) 25 MG TB24  2. Insomnia due to medical condition  G47.01 nortriptyline (PAMELOR) 25 MG capsule  3. High risk medication use  Z79.899 EKG 12-Lead  4. Noncompliance with treatment plan  Z91.11     Past Psychiatric History: Reviewed past psychiatric history from my progress note on 04/25/2017.  Past trials of Wellbutrin, Celexa, Xanax, Elavil, Prozac, Lexapro, Cymbalta, Effexor.  Past Medical History:  Past Medical History:  Diagnosis Date  . Anxiety   . Depression   . Headache   . Kidney stones   . Ovarian cyst     Past Surgical History:  Procedure Laterality Date  . FEMUR FRACTURE SURGERY Right 1980's  . TONSILLECTOMY    . TUBAL LIGATION      Family Psychiatric History: Reviewed family psychiatric history from my progress note on 04/25/2017. Family History:  Family History  Problem Relation Age of Onset  . Anxiety disorder Mother   . Cancer Neg Hx   . Diabetes Neg Hx   . Heart disease Neg Hx   . Breast cancer Neg Hx     Social History: Reviewed social history from my progress note on 04/25/2017. Social History   Socioeconomic History  . Marital status: Single    Spouse name: Not on file  . Number  of children: Not on file  . Years of education: Not on file  . Highest education level: Not on file  Occupational History  . Not on file  Tobacco Use  . Smoking status: Never Smoker  . Smokeless tobacco: Never Used  Substance and Sexual Activity  . Alcohol use: No    Alcohol/week: 0.0 standard drinks  . Drug use: No  . Sexual activity: Not Currently    Birth control/protection: Surgical  Other Topics Concern  . Not on file  Social History Narrative  . Not on file   Social Determinants of Health    Financial Resource Strain:   . Difficulty of Paying Living Expenses: Not on file  Food Insecurity:   . Worried About Programme researcher, broadcasting/film/videounning Out of Food in the Last Year: Not on file  . Ran Out of Food in the Last Year: Not on file  Transportation Needs:   . Lack of Transportation (Medical): Not on file  . Lack of Transportation (Non-Medical): Not on file  Physical Activity:   . Days of Exercise per Week: Not on file  . Minutes of Exercise per Session: Not on file  Stress:   . Feeling of Stress : Not on file  Social Connections:   . Frequency of Communication with Friends and Family: Not on file  . Frequency of Social Gatherings with Friends and Family: Not on file  . Attends Religious Services: Not on file  . Active Member of Clubs or Organizations: Not on file  . Attends BankerClub or Organization Meetings: Not on file  . Marital Status: Not on file    Allergies: No Known Allergies  Metabolic Disorder Labs: Lab Results  Component Value Date   HGBA1C 5.3 11/25/2014   No results found for: PROLACTIN No results found for: CHOL, TRIG, HDL, CHOLHDL, VLDL, LDLCALC Lab Results  Component Value Date   TSH 1.370 11/25/2014    Therapeutic Level Labs: No results found for: LITHIUM No results found for: VALPROATE No components found for:  CBMZ  Current Medications: Current Outpatient Medications  Medication Sig Dispense Refill  . clotrimazole-betamethasone (LOTRISONE) cream     . Desvenlafaxine Succinate ER (PRISTIQ) 25 MG TB24 Take 25 mg by mouth every evening. 10 tablet 0  . fluconazole (DIFLUCAN) 150 MG tablet Take 150 mg by mouth once.    . nortriptyline (PAMELOR) 25 MG capsule Take 1 capsule (25 mg total) by mouth at bedtime. 30 capsule 1  . valACYclovir (VALTREX) 1000 MG tablet Take 1 tablet (1,000 mg total) by mouth 2 (two) times daily. 60 tablet 2   No current facility-administered medications for this visit.     Musculoskeletal: Strength & Muscle Tone: UTA Gait & Station:  normal Patient leans: N/A  Psychiatric Specialty Exam: Review of Systems  Psychiatric/Behavioral: Positive for dysphoric mood and sleep disturbance. The patient is nervous/anxious.   All other systems reviewed and are negative.   There were no vitals taken for this visit.There is no height or weight on file to calculate BMI.  General Appearance: Casual  Eye Contact:  Fair  Speech:  Normal Rate  Volume:  Normal  Mood:  Anxious and Depressed  Affect:  Congruent  Thought Process:  Goal Directed and Descriptions of Associations: Intact  Orientation:  Full (Time, Place, and Person)  Thought Content: Rumination   Suicidal Thoughts:  No  Homicidal Thoughts:  No  Memory:  Immediate;   Fair Recent;   Fair Remote;   Fair  Judgement:  Fair  Insight:  Fair  Psychomotor Activity:  Normal  Concentration:  Concentration: Fair and Attention Span: Fair  Recall:  [employer] of Knowledge: Fair  Language: Fair  Akathisia:  No  Handed:  Right  AIMS (if indicated): Denies tremors, rigidity  Assets:  Communication Skills Desire for Improvement Housing Social Support  ADL's:  Intact  Cognition: WNL  Sleep:  Poor   Screenings: AIMS     Office Visit from 04/25/2016 in [facility]  AIMS Total Score  0    PHQ2-9     Office Visit from 05/12/2015 in [facility] Office Visit from 04/16/2015 in [facility] Office Visit from 11/25/2014 in [facility]  PHQ-2 Total Score  3  3  3   PHQ-9 Total Score  10  12  16        Assessment and Plan: Lamoine is a 48 year old Caucasian female who has a history of depression, anxiety, was evaluated by telemedicine today.  Patient continues to struggle with depressive symptoms as well as anxiety symptoms and sleep problems.  Patient has been noncompliant with follow-up recommendations as well as medications.  Discussed plan as noted below.  Plan Depression-unstable Taper of  Pristiq.  Patient provided instructions to do so. Start Pamelor 25 mg p.o. nightly.  Insomnia-restless Patient reports she struggles with sleep problems at night and also excessive sleep during the day on and off. We will start Pamelor which helps with sleep also. Discontinue trazodone for lack of benefit as well as noncompliance  We will order the following labs-TSH.  Patient with fatigue and depressive symptoms will benefit from a thyroid panel.  We will also order EKG since she is currently on Pamelor.  Patient agrees to get the labs as well as EKG done at her primary care office.  We will also refer patient for CBT.  I have sent a referral to Mrs. Gearldine Bienenstock.  Some time was spent providing education since she is noncompliant with treatment recommendations as well as follow-up.  Follow-up in clinic in 3-4 weeks or sooner if needed.  January 21 at 3:20 PM  I have spent atleast 25 minutes non face to face with patient today. More than 50 % of the time was spent for psychoeducation and supportive psychotherapy and care coordination. This note was generated in part or whole with voice recognition software. Voice recognition is usually quite accurate but there are transcription errors that can and very often do occur. I apologize for any typographical errors that were not detected and corrected.       Manson Passey, MD 02/20/2019, 6:15 PM

## 2019-02-20 NOTE — Patient Instructions (Signed)
Nortriptyline capsules What is this medicine? NORTRIPTYLINE (nor TRIP ti leen) is used to treat depression. This medicine may be used for other purposes; ask your health care provider or pharmacist if you have questions. COMMON BRAND NAME(S): Aventyl, Pamelor What should I tell my health care provider before I take this medicine? They need to know if you have any of these conditions:  bipolar disorder  Brugada syndrome  difficulty passing urine  glaucoma  heart disease  if you drink alcohol  liver disease  schizophrenia  seizures  suicidal thoughts, plans or attempt; a previous suicide attempt by you or a family member  thyroid disease  an unusual or allergic reaction to nortriptyline, other tricyclic antidepressants, other medicines, foods, dyes, or preservatives  pregnant or trying to get pregnant  breast-feeding How should I use this medicine? Take this medicine by mouth with a glass of water. Follow the directions on the prescription label. Take your doses at regular intervals. Do not take it more often than directed. Do not stop taking this medicine suddenly except upon the advice of your doctor. Stopping this medicine too quickly may cause serious side effects or your condition may worsen. A special MedGuide will be given to you by the pharmacist with each prescription and refill. Be sure to read this information carefully each time. Talk to your pediatrician regarding the use of this medicine in children. Special care may be needed. Overdosage: If you think you have taken too much of this medicine contact a poison control center or emergency room at once. NOTE: This medicine is only for you. Do not share this medicine with others. What if I miss a dose? If you miss a dose, take it as soon as you can. If it is almost time for your next dose, take only that dose. Do not take double or extra doses. What may interact with this medicine? Do not take this medicine with  any of the following medications:  cisapride  dronedarone  linezolid  MAOIs like Carbex, Eldepryl, Marplan, Nardil, and Parnate  methylene blue (injected into a vein)  pimozide  thioridazine This medicine may also interact with the following medications:  alcohol  antihistamines for allergy, cough, and cold  atropine  certain medicines for bladder problems like oxybutynin, tolterodine  certain medicines for depression like amitriptyline, fluoxetine, sertraline  certain medicines for Parkinson's disease like benztropine, trihexyphenidyl  certain medicines for stomach problems like dicyclomine, hyoscyamine  certain medicines for travel sickness like scopolamine  chlorpropamide  cimetidine  ipratropium  other medicines that prolong the QT interval (an abnormal heart rhythm) like dofetilide  other medicines that can cause serotonin syndrome like St. John's Wort, fentanyl, lithium, tramadol, tryptophan, buspirone, and some medicines for headaches like sumatriptan or rizatriptan  quinidine  reserpine  thyroid medicine This list may not describe all possible interactions. Give your health care provider a list of all the medicines, herbs, non-prescription drugs, or dietary supplements you use. Also tell them if you smoke, drink alcohol, or use illegal drugs. Some items may interact with your medicine. What should I watch for while using this medicine? Tell your doctor if your symptoms do not get better or if they get worse. Visit your doctor or health care professional for regular checks on your progress. Because it may take several weeks to see the full effects of this medicine, it is important to continue your treatment as prescribed by your doctor. Patients and their families should watch out for new or worsening thoughts   of suicide or depression. Also watch out for sudden changes in feelings such as feeling anxious, agitated, panicky, irritable, hostile, aggressive,  impulsive, severely restless, overly excited and hyperactive, or not being able to sleep. If this happens, especially at the beginning of treatment or after a change in dose, call your health care professional. You may get drowsy or dizzy. Do not drive, use machinery, or do anything that needs mental alertness until you know how this medicine affects you. Do not stand or sit up quickly, especially if you are an older patient. This reduces the risk of dizzy or fainting spells. Alcohol may interfere with the effect of this medicine. Avoid alcoholic drinks. Do not treat yourself for coughs, colds, or allergies without asking your doctor or health care professional for advice. Some ingredients can increase possible side effects. Your mouth may get dry. Chewing sugarless gum or sucking hard candy, and drinking plenty of water may help. Contact your doctor if the problem does not go away or is severe. This medicine may cause dry eyes and blurred vision. If you wear contact lenses you may feel some discomfort. Lubricating drops may help. See your eye doctor if the problem does not go away or is severe. This medicine can cause constipation. Try to have a bowel movement at least every 2 to 3 days. If you do not have a bowel movement for 3 days, call your doctor or health care professional. This medicine can make you more sensitive to the sun. Keep out of the sun. If you cannot avoid being in the sun, wear protective clothing and use sunscreen. Do not use sun lamps or tanning beds/booths. What side effects may I notice from receiving this medicine? Side effects that you should report to your doctor or health care professional as soon as possible:  allergic reactions like skin rash, itching or hives, swelling of the face, lips, or tongue  anxious  breathing problems  changes in vision  confusion  elevated mood, decreased need for sleep, racing thoughts, impulsive behavior  eye pain  fast, irregular  heartbeat  feeling faint or lightheaded, falls  feeling agitated, angry, or irritable  fever with increased sweating  hallucination, loss of contact with reality  seizures  stiff muscles  suicidal thoughts or other mood changes  tingling, pain, or numbness in the feet or hands  trouble passing urine or change in the amount of urine  trouble sleeping  unusually weak or tired  vomiting  yellowing of the eyes or skin Side effects that usually do not require medical attention (report to your doctor or health care professional if they continue or are bothersome):  change in sex drive or performance  change in appetite or weight  constipation  dizziness  dry mouth  nausea  tired  tremors  upset stomach This list may not describe all possible side effects. Call your doctor for medical advice about side effects. You may report side effects to FDA at 1-800-FDA-1088. Where should I keep my medicine? Keep out of the reach of children. Store at room temperature between 15 and 30 degrees C (59 and 86 degrees F). Keep container tightly closed. Throw away any unused medicine after the expiration date. NOTE: This sheet is a summary. It may not cover all possible information. If you have questions about this medicine, talk to your doctor, pharmacist, or health care provider.  2020 Elsevier/Gold Standard (2018-01-30 13:24:58)  

## 2019-02-27 ENCOUNTER — Other Ambulatory Visit: Payer: Self-pay

## 2019-02-27 ENCOUNTER — Ambulatory Visit (INDEPENDENT_AMBULATORY_CARE_PROVIDER_SITE_OTHER): Payer: BC Managed Care – PPO | Admitting: Clinical

## 2019-02-27 DIAGNOSIS — F339 Major depressive disorder, recurrent, unspecified: Secondary | ICD-10-CM | POA: Diagnosis not present

## 2019-02-27 NOTE — Progress Notes (Signed)
Virtual Visit via Video Note  I connected with Susan Garner on 02/27/19 at  1:00 PM EST by a video enabled telemedicine application and verified that I am speaking with the correct person using two identifiers.  Location: Patient: Home  Provider: Office   I discussed the limitations of evaluation and management by telemedicine and the availability of in person appointments. The patient expressed understanding and agreed to proceed.      Comprehensive Clinical Assessment (CCA) Note  02/27/2019 Susan Garner [id number]  Visit Diagnosis:      ICD-10-CM   1. Major depressive disorder, recurrent episode with anxious distress (HCC)  F33.9       CCA Part One  Part One has been completed on paper by the patient.  (See scanned document in Chart Review)  CCA Part Two A  Intake/Chief Complaint:  CCA Intake With Chief Complaint CCA Part Two Date: 02/27/19 Chief Complaint/Presenting Problem: The patient notes, " I get extremely worked up with what others are doing. I get Anxiety i get very tense and i have Depression". Patients Currently Reported Symptoms/Problems: The patient notes difficulty with Depression symptoms including sadness, fatigue change in appitite hopelessness, difficulty with sleep, low mood. The patient indicates difficulty with Anxiety symptoms including irratability, tention, and isolation. Collateral Involvement: None Individual's Strengths: I can work well with others. I am a hardworker Individual's Preferences: Watching TV, time with family, I want to get into exercising Individual's Abilities: Multitasking Type of Services Patient Feels Are Needed: Medication Management Initial Clinical Notes/Concerns: No additional  Mental Health Symptoms Depression:  Depression: Difficulty Concentrating, Hopelessness, Weight gain/loss, Sleep (too much or little), Irritability, Increase/decrease in appetite  Mania:  Mania: N/A  Anxiety:   Anxiety: Difficulty  concentrating, Fatigue, Irritability, Sleep, Tension, Worrying  Psychosis:  Psychosis: N/A  Trauma:  Trauma: N/A  Obsessions:  Obsessions: N/A  Compulsions:  Compulsions: N/A  Inattention:  Inattention: N/A  Hyperactivity/Impulsivity:  Hyperactivity/Impulsivity: N/A  Oppositional/Defiant Behaviors:  Oppositional/Defiant Behaviors: N/A  Borderline Personality:  Emotional Irregularity: N/A  Other Mood/Personality Symptoms:  Other Mood/Personality Symtpoms: No Additional   Mental Status Exam Appearance and self-care  Stature:  Stature: Average  Weight:  Weight: Average weight  Clothing:  Clothing: Casual  Grooming:  Grooming: Normal  Cosmetic use:  Cosmetic Use: Age appropriate  Posture/gait:  Posture/Gait: Normal  Motor activity:  Motor Activity: Not Remarkable  Sensorium  Attention:  Attention: Normal  Concentration:  Concentration: Anxiety interferes  Orientation:  Orientation: X5  Recall/memory:  Recall/Memory: Normal  Affect and Mood  Affect:  Affect: Anxious  Mood:  Mood: Depressed  Relating  Eye contact:  Eye Contact: Normal  Facial expression:  Facial Expression: Tense  Attitude toward examiner:  Attitude Toward Examiner: Cooperative  Thought and Language  Speech flow: Speech Flow: Normal  Thought content:     Preoccupation:  Preoccupations: (None noted)  Hallucinations:  Hallucinations: (None noted)  Organization:   Landscape architect of Knowledge:  Fund of Knowledge: Average  Intelligence:  Intelligence: Average  Abstraction:  Abstraction: Normal  Judgement:  Judgement: Normal  Reality Testing:  Reality Testing: Realistic  Insight:  Insight: Good  Decision Making:  Decision Making: Normal  Social Functioning  Social Maturity:  Social Maturity: Impulsive  Social Judgement:  Social Judgement: Normal  Stress  Stressors:  Stressors: Work  Coping Ability:  Coping Ability: English as a second language teacher Deficits:   None  Supports:   Family   Family and  Psychosocial History: Family history  Marital status: Single Are you sexually active?: Yes What is your sexual orientation?: Heterosexual Has your sexual activity been affected by drugs, alcohol, medication, or emotional stress?: No Does patient have children?: Yes How many children?: 1 How is patient's relationship with their children?: The patient notes the relationship can be stressful and conflictual  Childhood History:  Childhood History By whom was/is the patient raised?: Both parents Additional childhood history information: No Additional Description of patient's relationship with caregiver when they were a child: The patient notes, " Me and my Mother didnt get along great, i was with her for the most part". Patient's description of current relationship with people who raised him/her: The patient notes her realtionship with her Mother is currently positive. The patient notes that she is currently living with her Mother. How were you disciplined when you got in trouble as a child/adolescent?: The patient notes no consistent form of implemented discipline Does patient have siblings?: Yes Number of Siblings: 1 Description of patient's current relationship with siblings: The patient notes her interaction with her brother is positive Did patient suffer any verbal/emotional/physical/sexual abuse as a child?: No Did patient suffer from severe childhood neglect?: No Has patient ever been sexually abused/assaulted/raped as an adolescent or adult?: No Was the patient ever a victim of a crime or a disaster?: No Witnessed domestic violence?: No Has patient been effected by domestic violence as an adult?: No  CCA Part Two B  Employment/Work Situation: 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?: No Are There Guns or Other Weapons in Your Home?: No Are These Comptroller?: No Who Could Verify You Are Able To Have These Secured:: NA  Education: Education School Currently Attending: No Last Grade Completed: 12 Name of High School: [employer] Did [place] Graduate From [employer]?: Yes Did Theme park manager?: No Did You Attend Graduate School?: No Did You Have Any Special Interests In School?: NA Did You Have An Individualized Education Program (IIEP): No Did You Have Any Difficulty At School?: No  Religion: Religion/Spirituality Are You A Religious Person?: No How Might This Affect Treatment?: NA  Leisure/Recreation: Leisure / Recreation Leisure and Hobbies: The patient notes, " I like to go to the beach , be in the sun, and go swimming".  Exercise/Diet: Exercise/Diet Do You Exercise?: No Have You Gained or Lost A Significant Amount of Weight in the Past Six Months?: Yes-Gained Number of Pounds Gained: 10 Do You Follow a Special Diet?: No Do You Have Any Trouble Sleeping?: Yes Explanation of Sleeping Difficulties: The patient notes difficulty with staying asleep  CCA Part Two C  Alcohol/Drug Use: Alcohol / Drug Use Pain Medications: None Prescriptions: Desdsvenlafaxin  100mg  1x per day, Over the Counter: None History of alcohol / drug use?: No history of alcohol / drug abuse                      CCA Part Three  ASAM's:  Six Dimensions of Multidimensional Assessment  Dimension 1:  Acute Intoxication and/or Withdrawal Potential:     Dimension 2:  Biomedical Conditions and Complications:     Dimension 3:  Emotional, Behavioral, or Cognitive Conditions and Complications:     Dimension 4:  Readiness to Change:     Dimension 5:  Relapse, Continued use, or  Continued Problem Potential:     Dimension 6:  Recovery/Living Environment:      Substance use Disorder (SUD)    Social Function:  Social Functioning Social Maturity: Impulsive Social Judgement: Normal  Stress:  Stress Stressors: Work Coping Ability: Overwhelmed Patient Takes Medications The Way The Doctor Instructed?: Yes Priority Risk: Low Acuity  Risk Assessment- Self-Harm Potential: Risk Assessment For Self-Harm Potential Thoughts of Self-Harm: No current thoughts Method: No plan Availability of Means: No access/NA Additional Comments for Self-Harm Potential: The patient notes no current S/I  Risk Assessment -Dangerous to Others Potential: Risk Assessment For Dangerous to Others Potential Method: No Plan Availability of Means: No access or NA Intent: Vague intent or NA Notification Required: No need or identified person Additional Comments for Danger to Others Potential: The patient notes no current H/I  DSM5 Diagnoses: Patient Active Problem List   Diagnosis Date Noted  . Major depressive disorder, recurrent episode with anxious distress (HCC) 02/20/2019  . Insomnia due to medical condition 02/20/2019  . High risk medication use 02/20/2019  . Noncompliance with treatment plan 02/20/2019  . Depression, major, recurrent, moderate (HCC) 07/24/2015  . Kidney stone on right side 07/22/2015  . Right flank pain 07/22/2015  . Cyst of right ovary 07/22/2015  . Anxiety 11/25/2014  . Depression 11/25/2014  . Fatigue 11/25/2014  . Right ear pain 11/25/2014  . 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 Displacement of lumbar intervertebral disc without myelopathy 07/23/2013  . Bulge of lumbar disc without myelopathy 07/23/2013    Patient Centered Plan: Patient is on the following Treatment Plan(s):  Anxiety and Depression  Recommendations for Services/Supports/Treatments: Recommendations for  Services/Supports/Treatments Recommendations For Services/Supports/Treatments: Individual Therapy, Medication Management  Treatment Plan Summary: OP Treatment Plan Summary: The patient will work with the OPT therapist in management of her Anxiety and Depression to reduce/eliminate symptoms as measured by having no more than 2 Depressive/Anxiety episodes per week, as evidenced by the patient report.  Referrals to Alternative Service(s): Referred to Alternative Service(s):   Place:   Date:   Time:    Referred to Alternative Service(s):   Place:   Date:   Time:    Referred to Alternative Service(s):   Place:   Date:   Time:    Referred to Alternative Service(s):   Place:   Date:   Time:     I discussed the assessment and treatment plan with the patient. The patient was provided an opportunity to ask questions and all were answered. The patient agreed with the plan and demonstrated an understanding of the instructions.   The patient was advised to call back or seek an in-person evaluation if the symptoms worsen or if the condition fails to improve as anticipated.  I provided 60 minutes of non-face-to-face time during this encounter.  Winfred Burn , LCSW

## 2019-03-06 ENCOUNTER — Other Ambulatory Visit: Payer: Self-pay | Admitting: Psychiatry

## 2019-03-06 DIAGNOSIS — G4701 Insomnia due to medical condition: Secondary | ICD-10-CM

## 2019-03-06 DIAGNOSIS — F339 Major depressive disorder, recurrent, unspecified: Secondary | ICD-10-CM

## 2019-03-12 ENCOUNTER — Telehealth (HOSPITAL_COMMUNITY): Payer: Self-pay | Admitting: Clinical

## 2019-03-12 ENCOUNTER — Other Ambulatory Visit: Payer: Self-pay

## 2019-03-12 ENCOUNTER — Telehealth: Payer: Self-pay | Admitting: Psychiatry

## 2019-03-12 ENCOUNTER — Ambulatory Visit (HOSPITAL_COMMUNITY): Payer: BC Managed Care – PPO | Admitting: Clinical

## 2019-03-12 NOTE — Telephone Encounter (Signed)
labwork order mailed out 

## 2019-03-12 NOTE — Telephone Encounter (Signed)
The OPT therapist called as indicated for the scheduled patient session. The patient did not respond. The OPT therapist left VM giving 20 min window to return call to engage in session. The patient did not return call

## 2019-03-12 NOTE — Telephone Encounter (Signed)
Printed out TSH lab slip again.  Printed out EKG order again.  Shanda Bumps CMA to send it to [facility] primary for patient request.

## 2019-03-14 ENCOUNTER — Other Ambulatory Visit: Payer: Self-pay | Admitting: Psychiatry

## 2019-03-14 ENCOUNTER — Ambulatory Visit (INDEPENDENT_AMBULATORY_CARE_PROVIDER_SITE_OTHER): Payer: BC Managed Care – PPO | Admitting: Psychiatry

## 2019-03-14 ENCOUNTER — Other Ambulatory Visit: Payer: Self-pay

## 2019-03-14 DIAGNOSIS — F339 Major depressive disorder, recurrent, unspecified: Secondary | ICD-10-CM

## 2019-03-14 DIAGNOSIS — Z5329 Procedure and treatment not carried out because of patient's decision for other reasons: Secondary | ICD-10-CM

## 2019-03-14 NOTE — Progress Notes (Signed)
No response to call or text. 

## 2019-03-19 ENCOUNTER — Telehealth: Payer: Self-pay | Admitting: Psychiatry

## 2019-03-19 ENCOUNTER — Telehealth: Payer: Self-pay

## 2019-03-19 DIAGNOSIS — F339 Major depressive disorder, recurrent, unspecified: Secondary | ICD-10-CM

## 2019-03-19 NOTE — Telephone Encounter (Signed)
ekg order mailed 

## 2019-03-19 NOTE — Telephone Encounter (Signed)
Printed out EKG again Shanda Bumps CMA to send to patient.

## 2019-03-19 NOTE — Telephone Encounter (Signed)
pt called states she needs ekg orders with dx: codes

## 2019-03-19 NOTE — Telephone Encounter (Signed)
ekg order mailed

## 2019-03-19 NOTE — Telephone Encounter (Signed)
This has already been mailed pt should receive this week.

## 2019-03-20 NOTE — Telephone Encounter (Signed)
Please let patient know she is no longer on Pristiq , I had asked her to taper it off. She is on Pamelor.

## 2019-03-20 NOTE — Telephone Encounter (Signed)
pt called she received her ekg orders yesterday she is going today to get that done.  she wanted to know if you would send in another pristiq prescription she has only 14 pills left

## 2019-03-22 MED ORDER — DESVENLAFAXINE SUCCINATE ER 100 MG PO TB24: 100.0000 mg | ORAL_TABLET | Freq: Every day | ORAL | 0 refills | Status: DC

## 2019-03-22 NOTE — Telephone Encounter (Signed)
Attempted to reach patient to discuss medication , left VM.

## 2019-03-22 NOTE — Telephone Encounter (Signed)
pt called states that she didnt pick up the nortriptyline because she has not had her EKG done yet that it is set up for 04-01-19.

## 2019-03-22 NOTE — Addendum Note (Signed)
Addended byJomarie Longs on: 03/22/2019 12:08 PM   Modules accepted: Orders

## 2019-03-22 NOTE — Telephone Encounter (Signed)
Sent Pristiq 100 mg 10 days supply.

## 2019-03-23 ENCOUNTER — Other Ambulatory Visit: Payer: Self-pay | Admitting: Psychiatry

## 2019-03-23 DIAGNOSIS — F339 Major depressive disorder, recurrent, unspecified: Secondary | ICD-10-CM

## 2019-03-25 ENCOUNTER — Telehealth: Payer: Self-pay | Admitting: Psychiatry

## 2019-03-25 DIAGNOSIS — F339 Major depressive disorder, recurrent, unspecified: Secondary | ICD-10-CM

## 2019-03-25 DIAGNOSIS — G4701 Insomnia due to medical condition: Secondary | ICD-10-CM

## 2019-03-25 MED ORDER — SERTRALINE HCL 25 MG PO TABS: 25.0000 mg | ORAL_TABLET | Freq: Every day | ORAL | 0 refills | Status: DC

## 2019-03-25 MED ORDER — DESVENLAFAXINE SUCCINATE ER 25 MG PO TB24: 25.0000 mg | ORAL_TABLET | Freq: Every evening | ORAL | 0 refills | Status: DC

## 2019-03-25 NOTE — Telephone Encounter (Signed)
Return call to patient, she has not started taking the Pristiq 100 mg which was sent on Friday.  She reports she did not do well on the Pamelor it made her sleep through the day. Start Zoloft 25 mg daily. Stop Pamelor. Continue Pristiq 25 mg for the next 7 days and stop taking it.

## 2019-04-10 ENCOUNTER — Other Ambulatory Visit: Payer: Self-pay

## 2019-04-10 ENCOUNTER — Ambulatory Visit (INDEPENDENT_AMBULATORY_CARE_PROVIDER_SITE_OTHER): Payer: BC Managed Care – PPO | Admitting: Psychiatry

## 2019-04-10 ENCOUNTER — Encounter: Payer: Self-pay | Admitting: Psychiatry

## 2019-04-10 DIAGNOSIS — G4701 Insomnia due to medical condition: Secondary | ICD-10-CM | POA: Diagnosis not present

## 2019-04-10 DIAGNOSIS — Z9111 Patient's noncompliance with dietary regimen: Secondary | ICD-10-CM | POA: Diagnosis not present

## 2019-04-10 DIAGNOSIS — F339 Major depressive disorder, recurrent, unspecified: Secondary | ICD-10-CM | POA: Diagnosis not present

## 2019-04-10 DIAGNOSIS — Z91199 Patient's noncompliance with other medical treatment and regimen due to unspecified reason: Secondary | ICD-10-CM

## 2019-04-10 MED ORDER — SERTRALINE HCL 50 MG PO TABS: 75.0000 mg | ORAL_TABLET | Freq: Every day | ORAL | 0 refills | Status: DC
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
## 2019-04-29 ENCOUNTER — Ambulatory Visit: Payer: BC Managed Care – PPO | Admitting: Psychiatry

## 2019-04-30 NOTE — Progress Notes (Signed)
Patient: Susan Garner, Female    DOB: 1970/08/24, 49 y.o.   MRN: [id number] Visit Date: 05/02/2019  Today's Provider: [name], PA-C   Chief Complaint  Patient presents with  . Establish Care   Subjective:     Establish Care: Susan Garner is a 49 y.o. female who presents today to establish care as a new patient.   Patient was previously seeing Dr. Elna Breslow, psychiatry, and was going to a counselor. She had requested to be changed to a female counselor but never followed up. Does not wish to return to [facility]. She has tried Wellbutrin, Celexa, Xanax, Elavil, Prozac, Lexapro, Cymbalta, and Effexor previously without symptom relief. Reports she had previously been seen by our office and had been on Effexor 150mg  XR a few years back. She reports she had been on that for over 10 years previously but it lost effectiveness and that is when changes started. She reports Dr. never increased her Effexor past 75mg  when she was placed back on it. She does report home stressors. Her daughter is 66 and is in counseling currently. She has been in an on again off again relationship with a man that she reports she officially broke up with in January 2021. Since then she moved back in with her mother. Daughter never liked the boyfriend and this caused a lot of strain on her relationship with her daughter. She reports when they broke up she was in bed, crying for weeks.  -----------------------------------------------------------------   Review of Systems  Constitutional: Positive for fatigue.  HENT: Positive for ear pain, rhinorrhea, sinus pressure and sneezing.   Eyes: Negative.   Respiratory: Negative.   Cardiovascular: Negative.   Gastrointestinal: Negative.   Endocrine: Negative.   Genitourinary: Negative.   Musculoskeletal: Positive for neck pain.  Skin: Negative.   Allergic/Immunologic: Negative.   Neurological: Positive for syncope and headaches.    Hematological: Negative.   Psychiatric/Behavioral: The patient is nervous/anxious.     Social History      She  reports that she has never smoked. She has never used smokeless tobacco. She reports that she does not drink alcohol or use drugs.       Social History   Socioeconomic History  . Marital status: Single    Spouse name: Not on file  . Number of children: Not on file  . Years of education: Not on file  . Highest education level: Not on file  Occupational History  . Not on file  Tobacco Use  . Smoking status: Never Smoker  . Smokeless tobacco: Never Used  Substance and Sexual Activity  . Alcohol use: No    Alcohol/week: 0.0 standard drinks  . Drug use: No  . Sexual activity: Not Currently    Birth control/protection: Surgical  Other Topics Concern  . Not on file  Social History Narrative  . Not on file   Social Determinants of Health   Financial Resource Strain:   . Difficulty of Paying Living Expenses:   Food Insecurity:   . Worried About 15 in the Last Year:   . February 2021 in the Last Year:   Transportation Needs:   . Programme researcher, broadcasting/film/video (Medical):   Barista Lack of Transportation (Non-Medical):   Physical Activity:   . Days of Exercise per Week:   . Minutes of Exercise per Session:   Stress:   . Feeling of Stress :   Social Connections:   .  Frequency of Communication with Friends and Family:   . Frequency of Social Gatherings with Friends and Family:   . Attends Religious Services:   . Active Member of Clubs or Organizations:   . Attends Banker Meetings:   Marland Kitchen Marital Status:     Past Medical History:  Diagnosis Date  . Allergy   . Anxiety   . Depression   . Headache   . Kidney stones   . Ovarian cyst      Patient Active Problem List   Diagnosis Date Noted  . Major depressive disorder, recurrent episode with anxious distress (HCC) 02/20/2019  . Insomnia due to medical condition 02/20/2019  . High risk  medication use 02/20/2019  . Noncompliance with treatment plan 02/20/2019  . Depression, major, recurrent, moderate (HCC) 07/24/2015  . Kidney stone on right side 07/22/2015  . Right flank pain 07/22/2015  . Cyst of right ovary 07/22/2015  . Anxiety 11/25/2014  . Depression 11/25/2014  . Fatigue 11/25/2014  . Right ear pain 11/25/2014  . 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 Displacement of lumbar intervertebral disc without myelopathy 07/23/2013  . Bulge of lumbar disc without myelopathy 07/23/2013    Past Surgical History:  Procedure Laterality Date  . FEMUR FRACTURE SURGERY Right 1980's  . TONSILLECTOMY    . TUBAL LIGATION      Family History        Family Status  Relation Name Status  . Mother  Alive  . Father  Alive  . Brother  Alive  . Neg Hx  (Not Specified)        Her family history includes Anxiety disorder in her mother. There is no history of Cancer, Diabetes, Heart disease, or Breast cancer.      No Known Allergies   Current Outpatient Medications:  .  sertraline (ZOLOFT) 50 MG tablet, Take 1.5 tablets (75 mg total) by mouth daily. Start taking 1 tablet for 3 days and increase to 1.5 tablets. (Patient not taking: Reported on 05/02/2019), Disp: 45 tablet, Rfl: 0 .  traZODone (DESYREL) 50 MG tablet, , Disp: , Rfl:  .  valACYclovir (VALTREX) 1000 MG tablet, Take 1 tablet (1,000 mg total) by mouth 2 (two) times daily. (Patient not taking: Reported on 05/02/2019), Disp: 60 tablet, Rfl: 2   Patient Care Team: [name], PA-C as PCP - General (Family Medicine)    Objective:    Vitals: BP 100/69 (BP Location: Left Arm, Patient Position: Sitting, Cuff Size: Large)   Pulse 69   Temp (!) 97.2 F (36.2 C) (Temporal)   Resp 16   Ht 5\' 8"  (1.727 m)   Wt 176 lb 6.4 oz (80 kg)   BMI 26.82 kg/m    Vitals:   05/02/19 0836  BP: 100/69  Pulse: 69  Resp: 16  Temp: (!) 97.2 F (36.2 C)  TempSrc:  Temporal  Weight: 176 lb 6.4 oz (80 kg)  Height: 5\' 8"  (1.727 m)     Physical Exam Vitals reviewed.  Constitutional:      General: She is not in acute distress.    Appearance: Normal appearance. She is well-developed and normal weight. She is not ill-appearing or diaphoretic.  HENT:     Head: Normocephalic and atraumatic.     Right Ear: Tympanic membrane, ear canal and external ear normal.     Left Ear: Tympanic membrane, ear canal and external ear normal.  Eyes:  General: No scleral icterus.       Right eye: No discharge.        Left eye: No discharge.     Extraocular Movements: Extraocular movements intact.     Conjunctiva/sclera: Conjunctivae normal.     Pupils: Pupils are equal, round, and reactive to light.  Neck:     Thyroid: No thyromegaly.     Vascular: No JVD.     Trachea: No tracheal deviation.  Cardiovascular:     Rate and Rhythm: Normal rate and regular rhythm.     Pulses: Normal pulses.     Heart sounds: Normal heart sounds. No murmur. No friction rub. No gallop.   Pulmonary:     Effort: Pulmonary effort is normal. No respiratory distress.     Breath sounds: Normal breath sounds. No wheezing or rales.  Chest:     Chest wall: No tenderness.  Abdominal:     General: Abdomen is flat. Bowel sounds are normal. There is no distension.     Palpations: Abdomen is soft. There is no mass.     Tenderness: There is no abdominal tenderness. There is no guarding or rebound.  Musculoskeletal:        General: No tenderness. Normal range of motion.     Cervical back: Normal range of motion and neck supple.     Right lower leg: No edema.     Left lower leg: No edema.  Lymphadenopathy:     Cervical: No cervical adenopathy.  Skin:    General: Skin is warm and dry.     Capillary Refill: Capillary refill takes less than 2 seconds.     Findings: No rash.  Neurological:     General: No focal deficit present.     Mental Status: She is alert and oriented to person, place, and  time. Mental status is at baseline.  Psychiatric:        Attention and Perception: Attention and perception normal.        Mood and Affect: Mood is anxious.        Speech: Speech normal.        Behavior: Behavior normal. Behavior is cooperative.        Thought Content: Thought content normal.        Judgment: Judgment normal.      Depression Screen PHQ 2/9 Scores 05/02/2019 05/12/2015 04/16/2015 11/25/2014  PHQ - 2 Score 6 3 3 3   PHQ- 9 Score 19 10 12 16        Assessment & Plan:     Routine Health Maintenance and Physical Exam  Exercise Activities and Dietary recommendations Goals   None      There is no immunization history on file for this patient.  Health Maintenance  Topic Date Due  . HIV Screening  Never done  . TETANUS/TDAP  Never done  . PAP SMEAR-Modifier  01/21/2018  . INFLUENZA VACCINE  Never done     Discussed health benefits of physical activity, and encouraged her to engage in regular exercise appropriate for her age and condition.    1. BMI 26.0-26.9,adult Counseled patient on healthy lifestyle modifications including dieting and exercise.   2. GAD (generalized anxiety disorder) Will stop sertraline 50mg , change to Effexor XR 150mg . Add Buspar 5mg  TID for anxiety. F/U in 4-6 weeks.  - venlafaxine XR (EFFEXOR-XR) 150 MG 24 hr capsule; Take 1 capsule (150 mg total) by mouth daily with breakfast.  Dispense: 90 capsule; Refill: 0 - busPIRone (BUSPAR) 5 MG  tablet; Take 1 tablet (5 mg total) by mouth 3 (three) times daily.  Dispense: 90 tablet; Refill: 3  3. Depression, major, single episode, moderate (HCC) See above medical treatment plan. - venlafaxine XR (EFFEXOR-XR) 150 MG 24 hr capsule; Take 1 capsule (150 mg total) by mouth daily with breakfast.  Dispense: 90 capsule; Refill: 0 - busPIRone (BUSPAR) 5 MG tablet; Take 1 tablet (5 mg total) by mouth 3 (three) times daily.  Dispense: 90 tablet; Refill: 3  4. Seasonal allergic rhinitis due to  pollen Stable. Diagnosis pulled for medication refill. Continue current medical treatment plan. - cetirizine (ZYRTEC) 10 MG tablet; Take 1 tablet (10 mg total) by mouth daily.  Dispense: 90 tablet; Refill: 1  5. Tension headache Noted to have daily headache. Radiates from the neck up and over the head around the ears. Suspect tension headaches. Will give low dose flexeril to use prn at bedtime. Discussed making sure to have a good pillow with good neck support as well.  - cyclobenzaprine (FLEXERIL) 5 MG tablet; Take 1 tablet (5 mg total) by mouth at bedtime as needed for muscle spasms.  Dispense: 30 tablet; Refill: 1  --------------------------------------------------------------------    [name], PA-C  [facility]

## 2019-05-02 ENCOUNTER — Ambulatory Visit: Payer: BC Managed Care – PPO | Admitting: Physician Assistant

## 2019-05-02 ENCOUNTER — Encounter: Payer: Self-pay | Admitting: Physician Assistant

## 2019-05-02 ENCOUNTER — Other Ambulatory Visit: Payer: Self-pay

## 2019-05-02 VITALS — BP 100/69 | HR 69 | Temp 97.2°F | Resp 16 | Ht 68.0 in | Wt 176.4 lb

## 2019-05-02 DIAGNOSIS — F411 Generalized anxiety disorder: Secondary | ICD-10-CM

## 2019-05-02 DIAGNOSIS — F321 Major depressive disorder, single episode, moderate: Secondary | ICD-10-CM | POA: Diagnosis not present

## 2019-05-02 DIAGNOSIS — Z6826 Body mass index (BMI) 26.0-26.9, adult: Secondary | ICD-10-CM

## 2019-05-02 DIAGNOSIS — G44209 Tension-type headache, unspecified, not intractable: Secondary | ICD-10-CM

## 2019-05-02 DIAGNOSIS — J301 Allergic rhinitis due to pollen: Secondary | ICD-10-CM

## 2019-05-02 MED ORDER — BUSPIRONE HCL 5 MG PO TABS: 5.0000 mg | ORAL_TABLET | Freq: Three times a day (TID) | ORAL | 3 refills | Status: DC

## 2019-05-02 MED ORDER — VENLAFAXINE HCL ER 150 MG PO CP24: 150.0000 mg | ORAL_CAPSULE | Freq: Every day | ORAL | 0 refills | Status: DC

## 2019-05-02 MED ORDER — CYCLOBENZAPRINE HCL 5 MG PO TABS: 5.0000 mg | ORAL_TABLET | Freq: Every evening | ORAL | 1 refills | Status: DC | PRN

## 2019-05-02 MED ORDER — CETIRIZINE HCL 10 MG PO TABS: 10.0000 mg | ORAL_TABLET | Freq: Every day | ORAL | 1 refills | Status: DC

## 2019-05-02 NOTE — Patient Instructions (Addendum)
Tension Headache, Adult A tension headache is pain, pressure, or aching in your head. Tension headaches can last from 30 minutes to several days. Follow these instructions at home: Managing pain  Take over-the-counter and prescription medicines only as told by your doctor.  When you have a headache, lie down in a dark, quiet room.  If told, put ice on your head and neck: ? Put ice in a plastic bag. ? Place a towel between your skin and the bag. ? Leave the ice on for 20 minutes, 2-3 times a day.  If told, put heat on the back of your neck. Do this as often as your doctor tells you to. Use the kind of heat that your doctor recommends, such as a moist heat pack or a heating pad. ? Place a towel between your skin and the heat. ? Leave the heat on for 20-30 minutes. ? Remove the heat if your skin turns bright red. Eating and drinking  Eat meals on a regular schedule.  Watch how much alcohol you drink: ? If you are a woman and are not pregnant, do not drink more than 1 drink a day. ? If you are a man, do not drink more than 2 drinks a day.  Drink enough fluid to keep your pee (urine) pale yellow.  Do not use a lot of caffeine, or stop using caffeine. Lifestyle  Get enough sleep. Get 7-9 hours of sleep each night. Or get the amount of sleep that your doctor tells you to.  At bedtime, remove all electronic devices from your room. Examples of electronic devices are computers, phones, and tablets.  Find ways to lessen your stress. Some things that can lessen stress are: ? Exercise. ? Deep breathing. ? Yoga. ? Music. ? Positive thoughts.  Sit up straight. Do not tighten (tense) your muscles.  Do not use any products that have nicotine or tobacco in them, such as cigarettes and e-cigarettes. If you need help quitting, ask your doctor. General instructions   Keep all follow-up visits as told by your doctor. This is important.  Avoid things that can bring on headaches. Keep a  journal to find out if certain things bring on headaches. For example, write down: ? What you eat and drink. ? How much sleep you get. ? Any change to your diet or medicines. Contact a doctor if:  Your headache does not get better.  Your headache comes back.  You have a headache and sounds, light, or smells bother you.  You feel sick to your stomach (nauseous) or you throw up (vomit).  Your stomach hurts. Get help right away if:  You suddenly get a very bad headache along with any of these: ? A stiff neck. ? Feeling sick to your stomach. ? Throwing up. ? Feeling weak. ? Trouble seeing. ? Feeling short of breath. ? A rash. ? Feeling unusually sleepy. ? Trouble speaking. ? Pain in your eye or ear. ? Trouble walking or balancing. ? Feeling like you will pass out (faint). ? Passing out. Summary  A tension headache is pain, pressure, or aching in your head.  Tension headaches can last from 30 minutes to several days.  Lifestyle changes and medicines may help relieve pain. This information is not intended to replace advice given to you by your health care provider. Make sure you discuss any questions you have with your health care provider. Document Revised: 12/05/2018 Document Reviewed: 05/20/2016 Elsevier Patient Education  2020 [employer].  10  Relaxation Techniques That Zap Stress Fast By Tomasa Hosteller Moninger   Listen  Relax. You deserve it, it's good for you, and it takes less time than you think. You don't need a spa weekend or a retreat. Each of these stress-relieving tips can get you from OMG to om in less than 15 minutes. 1. Meditate  A few minutes of practice per day can help ease anxiety. "Research suggests that daily meditation may alter the brain's neural pathways, making you more resilient to stress," says psychologist Vinson Moselle, PhD, a [place] and wellness coach. It's simple. Sit up straight with both feet on the floor. Close your eyes.  Focus your attention on reciting -- out loud or silently -- a positive mantra such as "I feel at peace" or "I love myself." Place one hand on your belly to sync the mantra with your breaths. Let any distracting thoughts float by like clouds. 2. Breathe Deeply  Take a 5-minute break and focus on your breathing. Sit up straight, eyes closed, with a hand on your belly. Slowly inhale through your nose, feeling the breath start in your abdomen and work its way to the top of your head. Reverse the process as you exhale through your mouth.  "Deep breathing counters the effects of stress by slowing the heart rate and lowering blood pressure," psychologist Verdene Rio, PhD, says. She's a certified life coach in [place], [place] 3. Be Present  Slow down.  "Take 5 minutes and focus on only one behavior with awareness," Serena Colonel says. Notice how the air feels on your face when you're walking and how your feet feel hitting the ground. Enjoy the texture and taste of each bite of food. When you spend time in the moment and focus on your senses, you should feel less tense. 4. Reach Out  Your social network is one of your best tools for handling stress. Talk to others -- preferably face to face, or at least on the phone. Share what's going on. You can get a fresh perspective while keeping your connection strong. 5. Tune In to Your Body  Mentally scan your body to get a sense of how stress affects it each day. Lie on your back, or sit with your feet on the floor. Start at your toes and work your way up to your scalp, noticing how your body feels.  "Simply be aware of places you feel tight or loose without trying to change anything," Serena Colonel says. For 1 to 2 minutes, imagine each deep breath flowing to that body part. Repeat this process as you move your focus up your body, paying close attention to sensations you feel in each body part. 6. Decompress  Place a warm heat wrap around your neck and shoulders for 10 minutes. Close your  eyes and relax your face, neck, upper chest, and back muscles. Remove the wrap, and use a tennis ball or foam roller to massage away tension.  "Place the ball between your back and the wall. Lean into the ball, and hold gentle pressure for up to 15 seconds. Then move the ball to another spot, and apply pressure," says Derenda Mis, a nurse practitioner and assistant professor at [employer] in [place]. 7. Laugh Out Loud  A good belly laugh doesn't just lighten the load mentally. It lowers cortisol, your body's stress hormone, and boosts brain chemicals called endorphins, which help your mood. Lighten up by tuning in to your favorite sitcom or video, reading  the comics, or chatting with someone who makes you smile. 8. Crank Up the Tunes  Research shows that listening to soothing music can lower blood pressure, heart rate, and anxiety. "Create a playlist of songs or nature sounds (the ocean, a bubbling brook, birds chirping), and allow your mind to focus on the different melodies, instruments, or singers in the piece," Benninger says. You also can blow off steam by rocking out to more upbeat tunes -- or singing at the top of your lungs! 9. Get Moving  You don't have to run in order to get a runner's high. All forms of exercise, including yoga and walking, can ease depression and anxiety by helping the brain release feel-good chemicals and by giving your body a chance to practice dealing with stress. You can go for a quick walk around the block, take the stairs up and down a few flights, or do some stretching exercises like head rolls and shoulder shrugs. 10. Be Grateful  Keep a gratitude journal or several (one by your bed, one in your purse, and one at work) to help you remember all the things that are good in your life.  "Being grateful for your blessings cancels out negative thoughts and worries," says Joni Emmerling, a wellness coach in [place], [place].  Use these  journals to savor good experiences like a child's smile, a sunshine-filled day, and good health. Don't forget to celebrate accomplishments like mastering a new task at work or a new hobby. When you start feeling stressed, spend a few minutes looking through your notes to remind yourself what really matters.

## 2019-05-07 ENCOUNTER — Other Ambulatory Visit: Payer: Self-pay | Admitting: Psychiatry

## 2019-05-07 DIAGNOSIS — F339 Major depressive disorder, recurrent, unspecified: Secondary | ICD-10-CM

## 2019-05-07 DIAGNOSIS — G4701 Insomnia due to medical condition: Secondary | ICD-10-CM

## 2019-05-21 ENCOUNTER — Other Ambulatory Visit: Payer: Self-pay | Admitting: Psychiatry

## 2019-05-21 DIAGNOSIS — G4701 Insomnia due to medical condition: Secondary | ICD-10-CM

## 2019-05-21 DIAGNOSIS — F339 Major depressive disorder, recurrent, unspecified: Secondary | ICD-10-CM

## 2019-05-24 ENCOUNTER — Other Ambulatory Visit: Payer: Self-pay | Admitting: Physician Assistant

## 2019-05-24 DIAGNOSIS — F411 Generalized anxiety disorder: Secondary | ICD-10-CM

## 2019-05-24 DIAGNOSIS — F321 Major depressive disorder, single episode, moderate: Secondary | ICD-10-CM

## 2019-05-24 NOTE — Telephone Encounter (Signed)
Requested medication (s) are due for refill today:yes  Requested medication (s) are on the active medication list: yes  Last refill:  05/02/19  Future visit scheduled: yes  Notes to clinic: Pharmacy needs Dx code Please convert to 90 day per pharmacy    Requested Prescriptions  Pending Prescriptions Disp Refills   busPIRone (BUSPAR) 5 MG tablet [Pharmacy Med Name: BUSPIRONE HCL 5 MG TABLET] 270 tablet 2    Sig: TAKE 1 TABLET BY MOUTH THREE TIMES A DAY      Psychiatry: Anxiolytics/Hypnotics - Non-controlled Passed - 05/24/2019 11:33 AM      Passed - Valid encounter within last 6 months    Recent Outpatient Visits           3 weeks ago BMI 26.0-26.9,adult   [facility] [place], [place], [place]   3 years ago Severe episode of recurrent major depressive disorder, without psychotic features [facility])   [facility] Ellyn Hack, MD   4 years ago Depression   [facility] Ellyn Hack, MD   4 years ago Depression   [facility] Ellyn Hack, MD   4 years ago Anxiety   [facility] Ellyn Hack, MD       Future Appointments             In 6 days Rosezetta Schlatter, Alessandra Bevels, PA-C [facility]

## 2019-05-27 NOTE — Progress Notes (Signed)
MyChart Video Visit      Virtual Visit via Video Note   This visit type was conducted due to national recommendations for restrictions regarding the COVID-19 Pandemic (e.g. social distancing) in an effort to limit this patient's exposure and mitigate transmission in our community. This patient is at least at moderate risk for complications without adequate follow up. This format is felt to be most appropriate for this patient at this time. Physical exam was limited by quality of the video and audio technology used for the visit.   Patient location: Home Provider location: [facility]    Patient: Susan Garner   DOB: 1970/04/28   49 y.o. Female  MRN: [id number] Visit Date: 05/30/2019  Today's Provider: [name], PA-C  Subjective:    Chief Complaint  Patient presents with  . Follow-up    Depression   HPI Depression follow up: Patient was seen 4-6 weeks ago for Depression and GAD. Sertraline 50mg  was stopped and changed to Effexor XR 150mg . Added Buspar 5mg  TID.  Per patient she feels "weak" not a lot of  Energy. She is not having any side effect.  She complains of none.  Depression screen [facility] 2/9 05/30/2019 05/02/2019 05/12/2015  Decreased Interest 1 3 2   Down, Depressed, Hopeless 1 3 1   PHQ - 2 Score 2 6 3   Altered sleeping 1 3 3   Tired, decreased energy 3 3 3   Change in appetite 1 2 0  Feeling bad or failure about yourself  0 1 1  Trouble concentrating 2 1 0  Moving slowly or fidgety/restless 1 3 0  Suicidal thoughts 0 0 0  PHQ-9 Score 10 19 10   Difficult doing work/chores Somewhat difficult Extremely dIfficult Very difficult   GAD 7 : Generalized Anxiety Score 05/30/2019  Nervous, Anxious, on Edge 1  Control/stop worrying 0  Worry too much - different things 1  Trouble relaxing 1  Restless 1  Easily annoyed or irritable 1  Afraid - awful might happen 0  Total GAD 7 Score 5  Anxiety Difficulty Somewhat difficult     Patient Active Problem List   Diagnosis Date Noted  . Major depressive disorder, recurrent episode with anxious distress (HCC) 02/20/2019  . Insomnia due to medical condition 02/20/2019  . High risk medication use 02/20/2019  . Noncompliance with treatment plan 02/20/2019  . Kidney stone on right side 07/22/2015  . Right flank pain 07/22/2015  . Cyst of right ovary 07/22/2015  . Anxiety 11/25/2014  . Depression 11/25/2014  . Fatigue 11/25/2014  . Right ear pain 11/25/2014  . 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 Displacement of lumbar intervertebral disc without myelopathy 07/23/2013  . Bulge of lumbar disc without myelopathy 07/23/2013   Past Medical History:  Diagnosis Date  . Allergy   . Anxiety   . Depression   . Headache   . Kidney stones   . Ovarian cyst       Medications: Outpatient Medications Prior to Visit  Medication Sig  . busPIRone (BUSPAR) 5 MG tablet TAKE 1 TABLET BY MOUTH THREE TIMES A DAY  . cetirizine (ZYRTEC) 10 MG tablet Take 1 tablet (10 mg total) by mouth daily.  . cyclobenzaprine (FLEXERIL) 5 MG tablet Take 1 tablet (5 mg total) by mouth at bedtime as needed for muscle spasms.  07/24/2015 venlafaxine XR (EFFEXOR-XR) 150 MG 24 hr capsule Take 1 capsule (150 mg total) by mouth daily with breakfast.  .  traZODone (DESYREL) 50 MG tablet TAKE 1/2-1 TABLETS (25-50 MG TOTAL) BY MOUTH AT BEDTIME AS NEEDED FOR SLEEP.   No facility-administered medications prior to visit.     Review of Systems  Constitutional: Positive for fatigue.  HENT: Negative.   Respiratory: Negative.   Cardiovascular: Negative.   Gastrointestinal: Negative.   Musculoskeletal: Negative.   Psychiatric/Behavioral: The patient is nervous/anxious.     Last CBC Lab Results  Component Value Date   WBC 6.3 02/01/2018   HGB 12.6 02/01/2018   HCT 38.3 02/01/2018   MCV 93.6 02/01/2018   MCH 30.8 02/01/2018   RDW 12.2 02/01/2018   PLT 289 02/01/2018   Last thyroid  functions Lab Results  Component Value Date   TSH 1.370 11/25/2014        Objective:    There were no vitals taken for this visit. BP Readings from Last 3 Encounters:  05/02/19 100/69  02/01/18 122/74  12/08/15 110/70      Physical Exam Vitals reviewed.  Constitutional:      General: She is not in acute distress. Pulmonary:     Effort: No respiratory distress.  Neurological:     Mental Status: She is alert.  Psychiatric:        Mood and Affect: Mood normal.          Assessment & Plan:    1. GAD (generalized anxiety disorder) Improving with venlafaxine XR 150mg  daily and Buspar 5mg  TID. Advised she could decrease buspar as that may contribute to the fatigue. Will also check labs as below for other contributing factors for fatigue.  - CBC w/Diff/Platelet - Comprehensive Metabolic Panel (CMET) - T4 AND TSH - Vitamin D (25 hydroxy) - B12 and Folate Panel  2. Depression, major, single episode, mild ([place]) See above medical treatment plan. - CBC w/Diff/Platelet - Comprehensive Metabolic Panel (CMET) - T4 AND TSH - Vitamin D (25 hydroxy) - B12 and Folate Panel  3. Other fatigue See above medical treatment plan. - CBC w/Diff/Platelet - Comprehensive Metabolic Panel (CMET) - T4 AND TSH - Vitamin D (25 hydroxy) - B12 and Folate Panel     I discussed the assessment and treatment plan with the patient. The patient was provided an opportunity to ask questions and all were answered. The patient agreed with the plan and demonstrated an understanding of the instructions.   The patient was advised to call back or seek an in-person evaluation if the symptoms worsen or if the condition fails to improve as anticipated.  I provided 9 minutes of non-face-to-face time during this encounter.   Rubye Beach  [facility] [phone] (phone) [phone] (fax)  [place]

## 2019-05-30 ENCOUNTER — Telehealth (INDEPENDENT_AMBULATORY_CARE_PROVIDER_SITE_OTHER): Payer: BC Managed Care – PPO | Admitting: Physician Assistant

## 2019-05-30 ENCOUNTER — Encounter: Payer: Self-pay | Admitting: Physician Assistant

## 2019-05-30 DIAGNOSIS — F411 Generalized anxiety disorder: Secondary | ICD-10-CM

## 2019-05-30 DIAGNOSIS — R5383 Other fatigue: Secondary | ICD-10-CM

## 2019-05-30 DIAGNOSIS — F320 Major depressive disorder, single episode, mild: Secondary | ICD-10-CM

## 2019-06-17 ENCOUNTER — Telehealth: Payer: Self-pay

## 2019-06-17 DIAGNOSIS — W57XXXA Bitten or stung by nonvenomous insect and other nonvenomous arthropods, initial encounter: Secondary | ICD-10-CM

## 2019-06-17 NOTE — Telephone Encounter (Signed)
Left message advising pt.   Thanks,   -Laura  

## 2019-06-17 NOTE — Telephone Encounter (Signed)
Copied from CRM [id number]. Topic: General - Inquiry >> Jun 17, 2019 12:12 PM Reggie Pile, [place] wrote: Reason for CRM: Patient called in stating she would like to speak to pcp or nurse in regards to adding another lab order. Patient stated she got bit by a tick last week and would like to get tested for lyme disease as she does not feel herself. Please advise.

## 2019-06-17 NOTE — Addendum Note (Signed)
Addended by: [name] on: 06/17/2019 04:17 PM   Modules accepted: Orders

## 2019-06-17 NOTE — Telephone Encounter (Signed)
Tick borne labs ordered

## 2019-06-27 ENCOUNTER — Other Ambulatory Visit: Payer: Self-pay | Admitting: Physician Assistant

## 2019-06-28 ENCOUNTER — Telehealth: Payer: Self-pay

## 2019-06-28 NOTE — Telephone Encounter (Signed)
Pt advised.   Thanks,   -Amandeep Nesmith  

## 2019-06-28 NOTE — Telephone Encounter (Signed)
-----   Message from [name], [place] sent at 06/28/2019  9:12 AM EDT ----- Blood count is normal. Kidney and liver function are normal. Sodium, potassium, and calcium are normal. Sugar is normal. B12 and folate are normal. Thyroid is normal. Vit D is low. Recommend OTC vit D supplement of 2000 IU daily. [place] spotted fever and lyme titers are pending.

## 2019-06-29 LAB — B. BURGDORFI ANTIBODIES: Lyme IgG/IgM Ab: <0.91 {ISR} (ref 0.00–0.90)

## 2019-06-29 LAB — COMPREHENSIVE METABOLIC PANEL
ALT: 9 IU/L (ref 0–32)
AST: 12 IU/L (ref 0–40)
Albumin/Globulin Ratio: 2 (ref 1.2–2.2)
Albumin: 4.5 g/dL (ref 3.8–4.8)
Alkaline Phosphatase: 77 IU/L (ref 39–117)
BUN/Creatinine Ratio: 12 (ref 9–23)
BUN: 11 mg/dL (ref 6–24)
Bilirubin Total: 0.4 mg/dL (ref 0.0–1.2)
CO2: 23 mmol/L (ref 20–29)
Calcium: 9.2 mg/dL (ref 8.7–10.2)
Chloride: 104 mmol/L (ref 96–106)
Creatinine, Ser: 0.9 mg/dL (ref 0.57–1.00)
GFR calc Af Amer: 87 mL/min/{1.73_m2} (ref >59)
GFR calc non Af Amer: 76 mL/min/{1.73_m2} (ref >59)
Globulin, Total: 2.2 g/dL (ref 1.5–4.5)
Glucose: 76 mg/dL (ref 65–99)
Potassium: 4.1 mmol/L (ref 3.5–5.2)
Sodium: 138 mmol/L (ref 134–144)
Total Protein: 6.7 g/dL (ref 6.0–8.5)

## 2019-06-29 LAB — CBC WITH DIFFERENTIAL/PLATELET
Basophils Absolute: 0 10*3/uL (ref 0.0–0.2)
Basos: 1 %
EOS (ABSOLUTE): 0.1 10*3/uL (ref 0.0–0.4)
Eos: 1 %
Hematocrit: 36.5 % (ref 34.0–46.6)
Hemoglobin: 12.6 g/dL (ref 11.1–15.9)
Immature Grans (Abs): 0 10*3/uL (ref 0.0–0.1)
Immature Granulocytes: 0 %
Lymphocytes Absolute: 1.4 10*3/uL (ref 0.7–3.1)
Lymphs: 23 %
MCH: 30.6 pg (ref 26.6–33.0)
MCHC: 34.5 g/dL (ref 31.5–35.7)
MCV: 89 fL (ref 79–97)
Monocytes Absolute: 0.5 10*3/uL (ref 0.1–0.9)
Monocytes: 8 %
Neutrophils Absolute: 4.1 10*3/uL (ref 1.4–7.0)
Neutrophils: 67 %
Platelets: 279 10*3/uL (ref 150–450)
RBC: 4.12 x10E6/uL (ref 3.77–5.28)
RDW: 12.3 % (ref 11.7–15.4)
WBC: 6.2 10*3/uL (ref 3.4–10.8)

## 2019-06-29 LAB — T4 AND TSH
T4, Total: 6.8 ug/dL (ref 4.5–12.0)
TSH: 1.49 u[IU]/mL (ref 0.450–4.500)

## 2019-06-29 LAB — VITAMIN D 25 HYDROXY (VIT D DEFICIENCY, FRACTURES): Vit D, 25-Hydroxy: 21 ng/mL — ABNORMAL LOW (ref 30.0–100.0)

## 2019-06-29 LAB — ROCKY MTN SPOTTED FVR ABS PNL(IGG+IGM)
RMSF IgG: NEGATIVE
RMSF IgM: 0.39 index (ref 0.00–0.89)

## 2019-06-29 LAB — B12 AND FOLATE PANEL
Folate: 5.8 ng/mL (ref >3.0)
Vitamin B-12: 400 pg/mL (ref 232–1245)

## 2019-07-01 ENCOUNTER — Telehealth: Payer: Self-pay

## 2019-07-01 NOTE — Telephone Encounter (Signed)
Patient was advised.  

## 2019-07-01 NOTE — Telephone Encounter (Signed)
-----   Message from [name], [place] sent at 07/01/2019 10:23 AM EDT ----- RMSF and lyme titers are both negative.

## 2019-07-23 ENCOUNTER — Other Ambulatory Visit: Payer: Self-pay | Admitting: Physician Assistant

## 2019-07-23 DIAGNOSIS — F411 Generalized anxiety disorder: Secondary | ICD-10-CM

## 2019-07-23 DIAGNOSIS — F321 Major depressive disorder, single episode, moderate: Secondary | ICD-10-CM

## 2019-07-23 NOTE — Telephone Encounter (Signed)
Requested Prescriptions  Pending Prescriptions Disp Refills   venlafaxine XR (EFFEXOR-XR) 150 MG 24 hr capsule [Pharmacy Med Name: VENLAFAXINE HCL ER 150 MG CAP] 90 capsule 1    Sig: TAKE 1 CAPSULE (150 MG TOTAL) BY MOUTH DAILY WITH BREAKFAST.     Psychiatry: Antidepressants - SNRI - desvenlafaxine & venlafaxine Failed - 07/23/2019 10:31 AM      Failed - LDL in normal range and within 360 days    No results found for: LDLCALC, LDLC, HIRISKLDL, POCLDL, LDLDIRECT, REALLDLC, TOTLDLC       Failed - Total Cholesterol in normal range and within 360 days    No results found for: CHOL, POCCHOL, CHOLTOT       Failed - Triglycerides in normal range and within 360 days    No results found for: TRIG, POCTRIG       Passed - Completed PHQ-2 or PHQ-9 in the last 360 days.      Passed - Last BP in normal range    BP Readings from Last 1 Encounters:  05/02/19 100/69         Passed - Valid encounter within last 6 months    Recent Outpatient Visits          1 month ago GAD (generalized anxiety disorder)   [facility] [place], [place], [place]   2 months ago BMI 26.0-26.9,adult   [facility] [place], [place], [place]   3 years ago Severe episode of recurrent major depressive disorder, without psychotic features [facility])   [facility] Ellyn Hack, MD   4 years ago Depression   [facility] Ellyn Hack, MD   4 years ago Depression   [facility] Ellyn Hack, MD

## 2019-07-28 ENCOUNTER — Other Ambulatory Visit: Payer: Self-pay

## 2019-07-28 ENCOUNTER — Emergency Department
Admission: EM | Admit: 2019-07-28 | Discharge: 2019-07-28 | Disposition: A | Discharge status: Home / Self Care | Payer: Self-pay | Attending: Emergency Medicine | Admitting: Emergency Medicine

## 2019-07-28 ENCOUNTER — Encounter: Payer: Self-pay | Admitting: Emergency Medicine

## 2019-07-28 ENCOUNTER — Emergency Department: Payer: Self-pay

## 2019-07-28 DIAGNOSIS — M25551 Pain in right hip: Secondary | ICD-10-CM | POA: Insufficient documentation

## 2019-07-28 DIAGNOSIS — S52124A Nondisplaced fracture of head of right radius, initial encounter for closed fracture: Secondary | ICD-10-CM | POA: Insufficient documentation

## 2019-07-28 DIAGNOSIS — M25421 Effusion, right elbow: Secondary | ICD-10-CM | POA: Insufficient documentation

## 2019-07-28 DIAGNOSIS — Y9289 Other specified places as the place of occurrence of the external cause: Secondary | ICD-10-CM | POA: Insufficient documentation

## 2019-07-28 DIAGNOSIS — Y998 Other external cause status: Secondary | ICD-10-CM | POA: Insufficient documentation

## 2019-07-28 DIAGNOSIS — Y9301 Activity, walking, marching and hiking: Secondary | ICD-10-CM | POA: Insufficient documentation

## 2019-07-28 DIAGNOSIS — Z79899 Other long term (current) drug therapy: Secondary | ICD-10-CM | POA: Insufficient documentation

## 2019-07-28 DIAGNOSIS — S52044A Nondisplaced fracture of coronoid process of right ulna, initial encounter for closed fracture: Secondary | ICD-10-CM | POA: Insufficient documentation

## 2019-07-28 DIAGNOSIS — W108XXA Fall (on) (from) other stairs and steps, initial encounter: Secondary | ICD-10-CM | POA: Insufficient documentation

## 2019-07-28 MED ORDER — ONDANSETRON 4 MG PO TBDP
4.0000 mg | ORAL_TABLET | Freq: Once | ORAL | Status: AC
  Administered 2019-07-28: 4 mg via ORAL
  Filled 2019-07-28: qty 1

## 2019-07-28 MED ORDER — OXYCODONE-ACETAMINOPHEN 5-325 MG PO TABS: 1.0000 | ORAL_TABLET | Freq: Three times a day (TID) | ORAL | 0 refills | Status: AC | PRN

## 2019-07-28 MED ORDER — IBUPROFEN 600 MG PO TABS
600.0000 mg | ORAL_TABLET | Freq: Four times a day (QID) | ORAL | 0 refills | Status: DC | PRN
Start: 2019-07-28 — End: 2019-11-13

## 2019-07-28 MED ORDER — OXYCODONE-ACETAMINOPHEN 5-325 MG PO TABS
1.0000 | ORAL_TABLET | Freq: Once | ORAL | Status: AC
  Administered 2019-07-28: 1 via ORAL
  Filled 2019-07-28: qty 1

## 2019-07-28 NOTE — ED Provider Notes (Signed)
[facility] Emergency Department Provider Note  ____________________________________________  Time seen: Approximately 8:12 AM  I have reviewed the triage vital signs and the nursing notes.   HISTORY  Chief Complaint Fall    HPI Susan Garner is a 49 y.o. female that presents to the emergency department for evaluation of right elbow pain and right hip pain after a fall yesterday.  Patient tripped on some stairs and fell down about 3 stairs.  She landed on her right elbow and right hip. She is having difficulty moving her elbow due to pain. She did not hit her head or lose consciousness.  The pain is making her nauseous. No numbness or tingling.   Past Medical History:  Diagnosis Date  . Allergy   . Anxiety   . Depression   . Headache   . Kidney stones   . Ovarian cyst     Patient Active Problem List   Diagnosis Date Noted  . Major depressive disorder, recurrent episode with anxious distress (HCC) 02/20/2019  . Insomnia due to medical condition 02/20/2019  . High risk medication use 02/20/2019  . Noncompliance with treatment plan 02/20/2019  . Kidney stone on right side 07/22/2015  . Right flank pain 07/22/2015  . Cyst of right ovary 07/22/2015  . Anxiety 11/25/2014  . Depression 11/25/2014  . Fatigue 11/25/2014  . Right ear pain 11/25/2014  . 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 Displacement of lumbar intervertebral disc without myelopathy 07/23/2013  . Bulge of lumbar disc without myelopathy 07/23/2013    Past Surgical History:  Procedure Laterality Date  . FEMUR FRACTURE SURGERY Right 1980's  . TONSILLECTOMY    . TUBAL LIGATION      Prior to Admission medications   Medication Sig Start Date End Date Taking? Authorizing Provider  busPIRone (BUSPAR) 5 MG tablet TAKE 1 TABLET BY MOUTH THREE TIMES A DAY 05/24/19   [name], PA-C  cetirizine (ZYRTEC) 10 MG tablet Take 1  tablet (10 mg total) by mouth daily. 05/02/19   [name], PA-C  cyclobenzaprine (FLEXERIL) 5 MG tablet Take 1 tablet (5 mg total) by mouth at bedtime as needed for muscle spasms. 05/02/19   [name], PA-C  ibuprofen (ADVIL) 600 MG tablet Take 1 tablet (600 mg total) by mouth every 6 (six) hours as needed. 07/28/19   Enid Derry, PA-C  oxyCODONE-acetaminophen (PERCOCET) 5-325 MG tablet Take 1 tablet by mouth every 8 (eight) hours as needed for up to 3 days for severe pain. 07/28/19 07/31/19  Enid Derry, PA-C  venlafaxine XR (EFFEXOR-XR) 150 MG 24 hr capsule TAKE 1 CAPSULE (150 MG TOTAL) BY MOUTH DAILY WITH BREAKFAST. 07/23/19   [name], PA-C    Allergies Patient has no known allergies.  Family History  Problem Relation Age of Onset  . Anxiety disorder Mother   . Cancer Neg Hx   . Diabetes Neg Hx   . Heart disease Neg Hx   . Breast cancer Neg Hx     Social History Social History   Tobacco Use  . Smoking status: Never Smoker  . Smokeless tobacco: Never Used  Substance Use Topics  . Alcohol use: No    Alcohol/week: 0.0 standard drinks  . Drug use: No     Review of Systems  Cardiovascular: No chest pain. Respiratory: No SOB. Gastrointestinal: No abdominal pain.  Positive for nausea. Musculoskeletal: Positive for elbow and hip pain. Skin: Negative  for rash, abrasions, lacerations, ecchymosis. Neurological: Negative for headaches, numbness or tingling   ____________________________________________   PHYSICAL EXAM:  VITAL SIGNS: ED Triage Vitals [07/28/19 0730]  Enc Vitals Group     BP 123/63     Pulse Rate 85     Resp 14     Temp 98.3 F (36.8 C)     Temp Source Oral     SpO2 98 %     Weight      Height      Head Circumference      Peak Flow      Pain Score      Pain Loc      Pain Edu?      Excl. in GC?      Constitutional: Alert and oriented. Well appearing and in no acute distress. Eyes: Conjunctivae are normal. PERRL.  EOMI. Head: Atraumatic. ENT:      Ears:      Nose: No congestion/rhinnorhea.      Mouth/Throat: Mucous membranes are moist.  Neck: No stridor. No cervical spine tenderness to palpation. Cardiovascular: Normal rate, regular rhythm.  Good peripheral circulation.  Symmetric radial pulses bilaterally. Respiratory: Normal respiratory effort without tachypnea or retractions. Lungs CTAB. Good air entry to the bases with no decreased or absent breath sounds. Gastrointestinal: Bowel sounds 4 quadrants. Soft and nontender to palpation. No guarding or rigidity. No palpable masses. No distention.  Musculoskeletal: Full range of motion to all extremities. No gross deformities appreciated.  Limited range of motion of right elbow due to pain.  Swelling to posterior elbow.  Full range of motion of right hip.  Weightbearing. Compartments are soft. Neurologic:  Normal speech and language. No gross focal neurologic deficits are appreciated.  Skin:  Skin is warm, dry and intact. No rash noted. Psychiatric: Mood and affect are normal. Speech and behavior are normal. Patient exhibits appropriate insight and judgement.   ____________________________________________   LABS (all labs ordered are listed, but only abnormal results are displayed)  Labs Reviewed  POC URINE PREG, ED   ____________________________________________  EKG   ____________________________________________  RADIOLOGY Lexine Baton, personally viewed and evaluated these images (plain radiographs) as part of my medical decision making, as well as reviewing the written report by the radiologist.  DG Elbow Complete Right  Result Date: 07/28/2019 CLINICAL DATA:  c/o fall at around 2130 last night. Pt states that she tripped and fell down her brick stairs and landed in her right hip and elbow. Noticeable bruising to the right elbow. No previous injuries EXAM: RIGHT ELBOW - COMPLETE 3+ VIEW COMPARISON:  None. FINDINGS: Subtle fracture of  the radial head, as well as a subtle fracture from the coronoid process of the ulna. Fractures are nondisplaced and non comminuted. No other fractures.  Elbow joint is normally spaced and aligned. Positive joint effusion. IMPRESSION: 1. Subtle nondisplaced fracture of the radial head and coronoid process of the ulna. 2. No dislocation. 3. Joint effusion. Electronically Signed   By: Amie Portland M.D.   On: 07/28/2019 08:52   DG Hip Unilat W or Wo Pelvis 2-3 Views Right  Result Date: 07/28/2019 CLINICAL DATA:  c/o fall at around 2130 last night. Pt states that she tripped and fell down her brick stairs and landed in her right hip and elbow. Noticeable bruising to the right elbow. No previous injuries EXAM: DG HIP (WITH OR WITHOUT PELVIS) 2-3V RIGHT COMPARISON:  None. FINDINGS: There is no evidence of hip fracture or dislocation. There  is no evidence of arthropathy or other focal bone abnormality. IMPRESSION: Negative. Electronically Signed   By: Lajean Manes M.D.   On: 07/28/2019 08:53    ____________________________________________    PROCEDURES  Procedure(s) performed:    Procedures    Medications  ondansetron (ZOFRAN-ODT) disintegrating tablet 4 mg (4 mg Oral Given 07/28/19 0716)  oxyCODONE-acetaminophen (PERCOCET/ROXICET) 5-325 MG per tablet 1 tablet (1 tablet Oral Given 07/28/19 0732)     ____________________________________________   INITIAL IMPRESSION / ASSESSMENT AND PLAN / ED COURSE  Pertinent labs & imaging results that were available during my care of the patient were reviewed by me and considered in my medical decision making (see chart for details).  Review of the [place] CSRS was performed in accordance of the [place] prior to dispensing any controlled drugs.   Patient's diagnosis is consistent with nondisplaced radial head and coronoid process fracture.  Vital signs and exam are reassuring.  X-ray consistent with fracture.  Posterior elbow splint was placed.  Shoulder sling was  given.  Patient will be discharged home with prescriptions for Percocet and motrin. Patient is to follow up with orthopedics as directed. Patient is given ED precautions to return to the ED for any worsening or new symptoms.  Susan Garner was evaluated in Emergency Department on 07/28/2019 for the symptoms described in the history of present illness. She was evaluated in the context of the global COVID-19 pandemic, which necessitated consideration that the patient might be at risk for infection with the SARS-CoV-2 virus that causes COVID-19. Institutional protocols and algorithms that pertain to the evaluation of patients at risk for COVID-19 are in a state of rapid change based on information released by regulatory bodies including the CDC and federal and state organizations. These policies and algorithms were followed during the patient's care in the ED.   ____________________________________________  FINAL CLINICAL IMPRESSION(S) / ED DIAGNOSES  Final diagnoses:  Closed nondisplaced fracture of head of right radius, initial encounter  Closed nondisplaced fracture of coronoid process of right ulna, initial encounter  Effusion of right elbow      NEW MEDICATIONS STARTED DURING THIS VISIT:  ED Discharge Orders         Ordered    oxyCODONE-acetaminophen (PERCOCET) 5-325 MG tablet  Every 8 hours PRN     07/28/19 0923    ibuprofen (ADVIL) 600 MG tablet  Every 6 hours PRN     07/28/19 0923              This chart was dictated using voice recognition software/Dragon. Despite best efforts to proofread, errors can occur which can change the meaning. Any change was purely unintentional.    Laban Emperor, PA-C 07/28/19 1607    Arta Silence, MD 07/28/19 [phone]

## 2019-07-28 NOTE — ED Triage Notes (Signed)
Pt arrives POV to triage with c/o fall at around 2130 last night. Pt states that she tripped and fell down her brick stairs and landed in her right hip and elbow. Pt denies head trauma at this time and is in NAD.

## 2019-07-28 NOTE — ED Notes (Signed)
Patient reports falling down three brick stairs yesterday and injuring her right elbow and right hip. Able to ambulate with pain. Unable to move right arm

## 2019-09-24 ENCOUNTER — Other Ambulatory Visit: Payer: Self-pay | Admitting: Physician Assistant

## 2019-09-24 DIAGNOSIS — F321 Major depressive disorder, single episode, moderate: Secondary | ICD-10-CM

## 2019-09-24 DIAGNOSIS — F411 Generalized anxiety disorder: Secondary | ICD-10-CM

## 2019-09-24 NOTE — Telephone Encounter (Addendum)
Requested medication (s) are due for refill today: Yes  Requested medication (s) are on the active medication list: Yes  Last refill:  07/2019  Future visit scheduled: No  Notes to clinic:  Failed items per protocol lab requirements, unable to refill, diagnosis code needed     Requested Prescriptions  Pending Prescriptions Disp Refills   venlafaxine XR (EFFEXOR-XR) 150 MG 24 hr capsule [Pharmacy Med Name: VENLAFAXINE HCL ER 150 MG CAP] 90 capsule 2    Sig: TAKE 1 CAPSULE (150 MG TOTAL) BY MOUTH DAILY WITH BREAKFAST.      Psychiatry: Antidepressants - SNRI - desvenlafaxine & venlafaxine Failed - 09/24/2019  1:33 PM      Failed - LDL in normal range and within 360 days    No results found for: LDLCALC, LDLC, HIRISKLDL, POCLDL, LDLDIRECT, REALLDLC, TOTLDLC        Failed - Total Cholesterol in normal range and within 360 days    No results found for: CHOL, POCCHOL, CHOLTOT        Failed - Triglycerides in normal range and within 360 days    No results found for: TRIG, POCTRIG        Passed - Completed PHQ-2 or PHQ-9 in the last 360 days.      Passed - Last BP in normal range    BP Readings from Last 1 Encounters:  07/28/19 (!) 113/57          Passed - Valid encounter within last 6 months    Recent Outpatient Visits           3 months ago GAD (generalized anxiety disorder)   [facility] [place], [place], [place]   4 months ago BMI 26.0-26.9,adult   [facility] [place], [place], [place]   3 years ago Severe episode of recurrent major depressive disorder, without psychotic features [facility])   [facility] Ellyn Hack, MD   4 years ago Depression   [facility] Ellyn Hack, MD   4 years ago Depression   [facility] Ellyn Hack, MD

## 2019-11-13 ENCOUNTER — Ambulatory Visit
Admission: RE | Admit: 2019-11-13 | Discharge: 2019-11-13 | Disposition: A | Discharge status: Home / Self Care | Payer: BC Managed Care – PPO | Source: Ambulatory Visit | Attending: Physician Assistant | Admitting: Physician Assistant

## 2019-11-13 ENCOUNTER — Ambulatory Visit: Payer: BC Managed Care – PPO | Admitting: Physician Assistant

## 2019-11-13 ENCOUNTER — Telehealth: Payer: Self-pay | Admitting: Physician Assistant

## 2019-11-13 ENCOUNTER — Ambulatory Visit
Admission: RE | Admit: 2019-11-13 | Discharge: 2019-11-13 | Disposition: A | Discharge status: Home / Self Care | Payer: BC Managed Care – PPO | Attending: Physician Assistant | Admitting: Physician Assistant

## 2019-11-13 ENCOUNTER — Other Ambulatory Visit: Payer: Self-pay

## 2019-11-13 ENCOUNTER — Encounter: Payer: Self-pay | Admitting: Physician Assistant

## 2019-11-13 VITALS — BP 111/58 | HR 77 | Temp 98.4°F | Wt 170.0 lb

## 2019-11-13 DIAGNOSIS — R1900 Intra-abdominal and pelvic swelling, mass and lump, unspecified site: Secondary | ICD-10-CM

## 2019-11-13 DIAGNOSIS — M79604 Pain in right leg: Secondary | ICD-10-CM | POA: Diagnosis not present

## 2019-11-13 DIAGNOSIS — M79605 Pain in left leg: Secondary | ICD-10-CM

## 2019-11-13 DIAGNOSIS — R0781 Pleurodynia: Secondary | ICD-10-CM | POA: Diagnosis not present

## 2019-11-13 DIAGNOSIS — R0789 Other chest pain: Secondary | ICD-10-CM | POA: Diagnosis not present

## 2019-11-13 NOTE — Progress Notes (Signed)
Established patient visit   Patient: Susan Garner   DOB: 1970/10/12   49 y.o. Female  MRN: [id number] Visit Date: 11/13/2019  Today's healthcare provider: Mar Daring, PA-C   No chief complaint on file.  Subjective    HPI   Pt states she has had pain under her left breast.  Started about two days ago.  Pain is worse at night.  Pt denies nausea, vomiting, fevers, rashes.  She also states she has pain upper abdomen as well. Denies nausea, vomiting, urinary changes, or bowel changes. No rash.  Medications: Outpatient Medications Prior to Visit  Medication Sig  . venlafaxine XR (EFFEXOR-XR) 150 MG 24 hr capsule TAKE 1 CAPSULE (150 MG TOTAL) BY MOUTH DAILY WITH BREAKFAST.  . busPIRone (BUSPAR) 5 MG tablet TAKE 1 TABLET BY MOUTH THREE TIMES A DAY  . cetirizine (ZYRTEC) 10 MG tablet Take 1 tablet (10 mg total) by mouth daily.  . cyclobenzaprine (FLEXERIL) 5 MG tablet Take 1 tablet (5 mg total) by mouth at bedtime as needed for muscle spasms.  Marland Kitchen ibuprofen (ADVIL) 600 MG tablet Take 1 tablet (600 mg total) by mouth every 6 (six) hours as needed.   No facility-administered medications prior to visit.    Review of Systems  Constitutional: Negative.   Respiratory: Negative.   Cardiovascular: Negative.   Gastrointestinal: Negative.   Skin: Negative for rash.  Neurological: Negative.       Objective    BP (!) 111/58 (BP Location: Right Arm, Patient Position: Sitting, Cuff Size: Large)   Pulse 77   Temp 98.4 F (36.9 C) (Oral)   Wt 170 lb (77.1 kg)   SpO2 100%   BMI 25.85 kg/m    Physical Exam Vitals reviewed.  Constitutional:      General: She is not in acute distress.    Appearance: Normal appearance. She is well-developed. She is not ill-appearing or diaphoretic.  Cardiovascular:     Rate and Rhythm: Normal rate and regular rhythm.     Heart sounds: Normal heart sounds. No murmur heard.  No friction rub. No gallop.   Pulmonary:     Effort:  Pulmonary effort is normal. No respiratory distress.     Breath sounds: Normal breath sounds. No wheezing or rales.  Chest:     Chest wall: Mass and tenderness present.    Abdominal:     General: Abdomen is flat. Bowel sounds are normal. There is no distension.     Palpations: Abdomen is soft.     Tenderness: There is no abdominal tenderness.  Musculoskeletal:     Cervical back: Normal range of motion and neck supple.  Skin:    General: Skin is warm and dry.     Capillary Refill: Capillary refill takes less than 2 seconds.     Findings: No rash.  Neurological:     Mental Status: She is alert.      No results found for any visits on 11/13/19.  Assessment & Plan     1. Rib pain on left side Suspect possible lipoma or even possible swelling over costochondral junction. Patient has remote history of a fall in the last couple months where she fractured her right elbow. Could have had transfer injury to the left rib causing possible dislocation of the costochondral joint. Will get imaging as below. I will f/u pending results. I will also order [place] for further evaluation of the soft tissue mass that was felt. I will f/u pending  results of these testing. Advised for her to apply moist heat to the area and can take tylenol or IBU for the pain. Call if worsening or changes, such as a rash develops.  - DG Ribs Unilateral Left; Future - ANA,IFA RA Diag Pnl w/rflx Tit/Patn - C-reactive protein - Sed Rate (ESR)  2. Mass of soft tissue of abdomen See above medical treatment plan. - US Abdomen Limited; Future - ANA,IFA RA Diag Pnl w/rflx Tit/Patn - C-reactive protein - Sed Rate (ESR)  3. Leg pain, bilateral Reports front of her legs ache every morning. Will check labs as below for autoimmune source of myalgias and arthralgias. Will f/u pending labs.  - ANA,IFA RA Diag Pnl w/rflx Tit/Patn - C-reactive protein - Sed Rate (ESR)   No follow-ups on file.      Reynolds Bowl, PA-C,  have reviewed all documentation for this visit. The documentation on 11/14/19 for the exam, diagnosis, procedures, and orders are all accurate and complete.   Rubye Beach  [facility] [phone] (phone) [phone] (fax)  [place]

## 2019-11-13 NOTE — Telephone Encounter (Signed)
Pt would like someone to call her concerning her rib x ray results today. I had called her about scheduling her ultrasound and she is concerned,Thanks

## 2019-11-14 ENCOUNTER — Telehealth: Payer: Self-pay

## 2019-11-14 NOTE — Telephone Encounter (Signed)
-----   Message from [name], [place] sent at 11/14/2019 11:24 AM EDT ----- Susan Garner is completely normal. Will follow through with [place]

## 2019-11-14 NOTE — Telephone Encounter (Signed)
Copied from CRM [id number]. Topic: General - Other >> Nov 14, 2019  9:12 AM Tamela Oddi wrote: Reason for CRM: Patient would like the nurse or doctor to call her regarding the results of her x-ray.  Please advise and call at [phone]

## 2019-11-14 NOTE — Telephone Encounter (Signed)
Written by [name], PA-C on 11/14/2019 11:24 AM EDT Seen by patient Susan Garner on 11/14/2019 11:40 AM

## 2019-11-19 ENCOUNTER — Other Ambulatory Visit: Payer: Self-pay

## 2019-11-19 ENCOUNTER — Other Ambulatory Visit: Payer: Self-pay | Admitting: Physician Assistant

## 2019-11-19 ENCOUNTER — Ambulatory Visit
Admission: RE | Admit: 2019-11-19 | Discharge: 2019-11-19 | Disposition: A | Discharge status: Home / Self Care | Payer: BC Managed Care – PPO | Source: Ambulatory Visit | Attending: Physician Assistant | Admitting: Physician Assistant

## 2019-11-19 DIAGNOSIS — R1900 Intra-abdominal and pelvic swelling, mass and lump, unspecified site: Secondary | ICD-10-CM | POA: Diagnosis not present

## 2019-11-19 DIAGNOSIS — R222 Localized swelling, mass and lump, trunk: Secondary | ICD-10-CM | POA: Diagnosis not present

## 2019-11-20 ENCOUNTER — Telehealth: Payer: Self-pay

## 2019-11-20 NOTE — Telephone Encounter (Signed)
Written by [name], PA-C on 11/19/2019 4:42 PM EDT Seen by patient Susan Garner on 11/19/2019 6:45 PM

## 2019-11-20 NOTE — Telephone Encounter (Signed)
-----   Message from [name], [place] sent at 11/19/2019  4:42 PM EDT ----- [place] is also negative. No distinct mass noted. Most likely inflammation from the fall as we discussed where the cartilage and rib can slip on each other causing inflammation. Can use ibuprofen or tylenol for pain. Warm compresses over the area. Can also use biofreeze or icyhot locally.

## 2019-11-21 ENCOUNTER — Other Ambulatory Visit: Payer: Self-pay | Admitting: Physician Assistant

## 2019-11-21 DIAGNOSIS — F411 Generalized anxiety disorder: Secondary | ICD-10-CM

## 2019-11-21 DIAGNOSIS — F321 Major depressive disorder, single episode, moderate: Secondary | ICD-10-CM

## 2019-12-03 ENCOUNTER — Other Ambulatory Visit: Payer: Self-pay | Admitting: Obstetrics and Gynecology

## 2019-12-03 DIAGNOSIS — Z136 Encounter for screening for cardiovascular disorders: Secondary | ICD-10-CM | POA: Diagnosis not present

## 2019-12-03 DIAGNOSIS — Z1321 Encounter for screening for nutritional disorder: Secondary | ICD-10-CM | POA: Diagnosis not present

## 2019-12-03 DIAGNOSIS — Z1329 Encounter for screening for other suspected endocrine disorder: Secondary | ICD-10-CM | POA: Diagnosis not present

## 2019-12-03 DIAGNOSIS — Z113 Encounter for screening for infections with a predominantly sexual mode of transmission: Secondary | ICD-10-CM | POA: Diagnosis not present

## 2019-12-03 DIAGNOSIS — Z01419 Encounter for gynecological examination (general) (routine) without abnormal findings: Secondary | ICD-10-CM | POA: Diagnosis not present

## 2019-12-03 DIAGNOSIS — Z131 Encounter for screening for diabetes mellitus: Secondary | ICD-10-CM | POA: Diagnosis not present

## 2019-12-03 DIAGNOSIS — Z124 Encounter for screening for malignant neoplasm of cervix: Secondary | ICD-10-CM | POA: Diagnosis not present

## 2019-12-03 DIAGNOSIS — Z1231 Encounter for screening mammogram for malignant neoplasm of breast: Secondary | ICD-10-CM

## 2019-12-03 DIAGNOSIS — N898 Other specified noninflammatory disorders of vagina: Secondary | ICD-10-CM | POA: Diagnosis not present

## 2019-12-03 DIAGNOSIS — Z13220 Encounter for screening for lipoid disorders: Secondary | ICD-10-CM | POA: Diagnosis not present

## 2020-01-04 ENCOUNTER — Emergency Department: Payer: BC Managed Care – PPO

## 2020-01-04 ENCOUNTER — Other Ambulatory Visit: Payer: Self-pay

## 2020-01-04 ENCOUNTER — Encounter: Payer: Self-pay | Admitting: Emergency Medicine

## 2020-01-04 ENCOUNTER — Emergency Department
Admission: EM | Admit: 2020-01-04 | Discharge: 2020-01-04 | Disposition: A | Discharge status: Home / Self Care | Payer: BC Managed Care – PPO | Attending: Student in an Organized Health Care Education/Training Program | Admitting: Student in an Organized Health Care Education/Training Program

## 2020-01-04 DIAGNOSIS — N200 Calculus of kidney: Secondary | ICD-10-CM | POA: Diagnosis not present

## 2020-01-04 DIAGNOSIS — Z8742 Personal history of other diseases of the female genital tract: Secondary | ICD-10-CM | POA: Insufficient documentation

## 2020-01-04 DIAGNOSIS — Z87442 Personal history of urinary calculi: Secondary | ICD-10-CM | POA: Insufficient documentation

## 2020-01-04 DIAGNOSIS — D1779 Benign lipomatous neoplasm of other sites: Secondary | ICD-10-CM | POA: Diagnosis not present

## 2020-01-04 DIAGNOSIS — Z9851 Tubal ligation status: Secondary | ICD-10-CM | POA: Insufficient documentation

## 2020-01-04 DIAGNOSIS — N201 Calculus of ureter: Secondary | ICD-10-CM | POA: Diagnosis not present

## 2020-01-04 DIAGNOSIS — R1031 Right lower quadrant pain: Secondary | ICD-10-CM | POA: Diagnosis not present

## 2020-01-04 DIAGNOSIS — N132 Hydronephrosis with renal and ureteral calculous obstruction: Secondary | ICD-10-CM | POA: Insufficient documentation

## 2020-01-04 DIAGNOSIS — D171 Benign lipomatous neoplasm of skin and subcutaneous tissue of trunk: Secondary | ICD-10-CM | POA: Diagnosis not present

## 2020-01-04 DIAGNOSIS — R109 Unspecified abdominal pain: Secondary | ICD-10-CM

## 2020-01-04 DIAGNOSIS — N1330 Unspecified hydronephrosis: Secondary | ICD-10-CM | POA: Diagnosis not present

## 2020-01-04 LAB — COMPREHENSIVE METABOLIC PANEL
ALT: 10 U/L (ref 0–44)
AST: 14 U/L — ABNORMAL LOW (ref 15–41)
Albumin: 4.2 g/dL (ref 3.5–5.0)
Alkaline Phosphatase: 63 U/L (ref 38–126)
Anion gap: 8 (ref 5–15)
BUN: 16 mg/dL (ref 6–20)
CO2: 26 mmol/L (ref 22–32)
Calcium: 9.2 mg/dL (ref 8.9–10.3)
Chloride: 107 mmol/L (ref 98–111)
Creatinine, Ser: 0.87 mg/dL (ref 0.44–1.00)
GFR, Estimated: >60 mL/min (ref >60)
Glucose, Bld: 98 mg/dL (ref 70–99)
Potassium: 4.2 mmol/L (ref 3.5–5.1)
Sodium: 141 mmol/L (ref 135–145)
Total Bilirubin: 0.9 mg/dL (ref 0.3–1.2)
Total Protein: 7.6 g/dL (ref 6.5–8.1)

## 2020-01-04 LAB — URINALYSIS, COMPLETE (UACMP) WITH MICROSCOPIC
Bilirubin Urine: NEGATIVE
Glucose, UA: NEGATIVE mg/dL
Ketones, ur: NEGATIVE mg/dL
Nitrite: NEGATIVE
Protein, ur: 100 mg/dL — AB
RBC / HPF: >50 RBC/hpf — ABNORMAL HIGH (ref 0–5)
Specific Gravity, Urine: 1.021 (ref 1.005–1.030)
WBC, UA: >50 WBC/hpf — ABNORMAL HIGH (ref 0–5)
pH: 5 (ref 5.0–8.0)

## 2020-01-04 LAB — LIPASE, BLOOD: Lipase: 36 U/L (ref 11–51)

## 2020-01-04 LAB — CBC
HCT: 39.2 % (ref 36.0–46.0)
Hemoglobin: 12.7 g/dL (ref 12.0–15.0)
MCH: 30.4 pg (ref 26.0–34.0)
MCHC: 32.4 g/dL (ref 30.0–36.0)
MCV: 93.8 fL (ref 80.0–100.0)
Platelets: 310 10*3/uL (ref 150–400)
RBC: 4.18 MIL/uL (ref 3.87–5.11)
RDW: 12.1 % (ref 11.5–15.5)
WBC: 5.9 10*3/uL (ref 4.0–10.5)
nRBC: 0 % (ref 0.0–0.2)

## 2020-01-04 LAB — POC URINE PREG, ED: Preg Test, Ur: NEGATIVE

## 2020-01-04 MED ORDER — TAMSULOSIN HCL 0.4 MG PO CAPS: 0.4000 mg | ORAL_CAPSULE | Freq: Every day | ORAL | 0 refills | Status: DC

## 2020-01-04 MED ORDER — ONDANSETRON HCL 4 MG PO TABS: 4.0000 mg | ORAL_TABLET | Freq: Every day | ORAL | 0 refills | Status: DC | PRN

## 2020-01-04 MED ORDER — ONDANSETRON HCL 4 MG/2ML IJ SOLN
4.0000 mg | Freq: Once | INTRAMUSCULAR | Status: AC
  Administered 2020-01-04: 4 mg via INTRAVENOUS
  Filled 2020-01-04: qty 2

## 2020-01-04 MED ORDER — HYDROCODONE-ACETAMINOPHEN 5-325 MG PO TABS: 1.0000 | ORAL_TABLET | ORAL | 0 refills | Status: DC | PRN

## 2020-01-04 MED ORDER — HYDROMORPHONE HCL 1 MG/ML IJ SOLN: 0.5000 mg | INTRAMUSCULAR | Status: DC | PRN

## 2020-01-04 MED ORDER — IOHEXOL 300 MG/ML  SOLN
100.0000 mL | Freq: Once | INTRAMUSCULAR | Status: AC | PRN
  Administered 2020-01-04: 100 mL via INTRAVENOUS

## 2020-01-04 MED ORDER — MORPHINE SULFATE (PF) 4 MG/ML IV SOLN
4.0000 mg | INTRAVENOUS | Status: DC | PRN
  Administered 2020-01-04: 4 mg via INTRAVENOUS
  Filled 2020-01-04: qty 1

## 2020-01-04 MED ORDER — KETOROLAC TROMETHAMINE 30 MG/ML IJ SOLN
15.0000 mg | Freq: Once | INTRAMUSCULAR | Status: AC
  Administered 2020-01-04: 15 mg via INTRAVENOUS
  Filled 2020-01-04: qty 1

## 2020-01-04 MED ORDER — SODIUM CHLORIDE 0.9 % IV BOLUS
1000.0000 mL | Freq: Once | INTRAVENOUS | Status: AC
  Administered 2020-01-04: 1000 mL via INTRAVENOUS

## 2020-01-04 NOTE — ED Notes (Signed)
Pt ambulating around lobby. Pt notified that she will be brought back when room available. Pt states to this RN "I will sue this place if I am not brought back to a room. I am in pain". Marylene Land charge RN made aware. Report giving to [facility] and Dr. Roxan Hockey made aware.

## 2020-01-04 NOTE — ED Provider Notes (Signed)
[facility] Emergency Department Provider Note    First MD Initiated Contact with Patient 01/04/20 [phone]     (approximate)  I have reviewed the triage vital signs and the nursing notes.   HISTORY  Chief Complaint Abdominal Pain    HPI Tanysha Quant is a 49 y.o. female the below listed past medical history presents to the ER for evaluation of severe right lower quadrant pain woke her from sleep this morning.  States that she has been having vague abdominal pain associate with nausea and some right flank pain over the past 2 days.  Has not had any measured fevers.  She is status post tubal ligation does have a history of ovarian cysts as well as kidney stones but states this feels very different from both.  Is never had pain quite like this before.  Rates it as 10 out of 10 in severity.  Is not take anything for the pain.    Past Medical History:  Diagnosis Date  . Allergy   . Anxiety   . Depression   . Headache   . Kidney stones   . Ovarian cyst    Family History  Problem Relation Age of Onset  . Anxiety disorder Mother   . Cancer Neg Hx   . Diabetes Neg Hx   . Heart disease Neg Hx   . Breast cancer Neg Hx    Past Surgical History:  Procedure Laterality Date  . FEMUR FRACTURE SURGERY Right 1980's  . TONSILLECTOMY    . TUBAL LIGATION     Patient Active Problem List   Diagnosis Date Noted  . Major depressive disorder, recurrent episode with anxious distress (HCC) 02/20/2019  . Insomnia due to medical condition 02/20/2019  . High risk medication use 02/20/2019  . Noncompliance with treatment plan 02/20/2019  . Kidney stone on right side 07/22/2015  . Right flank pain 07/22/2015  . Cyst of right ovary 07/22/2015  . Anxiety 11/25/2014  . Depression 11/25/2014  . Fatigue 11/25/2014  . Right ear pain 11/25/2014  . 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 Displacement of lumbar  intervertebral disc without myelopathy 07/23/2013  . Bulge of lumbar disc without myelopathy 07/23/2013      Prior to Admission medications   Medication Sig Start Date End Date Taking? Authorizing Provider  HYDROcodone-acetaminophen (NORCO) 5-325 MG tablet Take 1 tablet by mouth every 4 (four) hours as needed for moderate pain. 01/04/20   Willy Eddy, MD  ondansetron (ZOFRAN) 4 MG tablet Take 1 tablet (4 mg total) by mouth daily as needed. 01/04/20 01/03/21  Willy Eddy, MD  tamsulosin (FLOMAX) 0.4 MG CAPS capsule Take 1 capsule (0.4 mg total) by mouth daily after supper. 01/04/20   Willy Eddy, MD  venlafaxine XR (EFFEXOR-XR) 150 MG 24 hr capsule TAKE 1 CAPSULE (150 MG TOTAL) BY MOUTH DAILY WITH BREAKFAST. 09/25/19   [name], PA-C    Allergies Patient has no known allergies.    Social History Social History   Tobacco Use  . Smoking status: Never Smoker  . Smokeless tobacco: Never Used  Substance Use Topics  . Alcohol use: No    Alcohol/week: 0.0 standard drinks  . Drug use: No    Review of Systems Patient denies headaches, rhinorrhea, blurry vision, numbness, shortness of breath, chest pain, edema, cough, abdominal pain, nausea, vomiting, diarrhea, dysuria, fevers, rashes or hallucinations unless otherwise stated above in HPI. ____________________________________________   PHYSICAL  EXAM:  VITAL SIGNS: Vitals:   01/04/20 0900 01/04/20 1000  BP: 132/78 119/72  Pulse: 82 85  Resp: 20 16  Temp:    SpO2: 98% 99%    Constitutional: Alert and oriented.  Eyes: Conjunctivae are normal.  Head: Atraumatic. Nose: No congestion/rhinnorhea. Mouth/Throat: Mucous membranes are moist.   Neck: No stridor. Painless ROM.  Cardiovascular: Normal rate, regular rhythm. Grossly normal heart sounds.  Good peripheral circulation. Respiratory: Normal respiratory effort.  No retractions. Lungs CTAB. Gastrointestinal: Soft with mil rlq ttp , no rebound or  guarding No distention. No abdominal bruits. right CVA tenderness. Genitourinary:  Musculoskeletal: No lower extremity tenderness nor edema.  No joint effusions. Neurologic:  Normal speech and language. No gross focal neurologic deficits are appreciated. No facial droop Skin:  Skin is warm, dry and intact. No rash noted. Psychiatric: Mood and affect are anxious  ____________________________________________   LABS (all labs ordered are listed, but only abnormal results are displayed)  Results for orders placed or performed during the hospital encounter of 01/04/20 (from the past 24 hour(s))  Lipase, blood     Status: None   Collection Time: 01/04/20  7:08 AM  Result Value Ref Range   Lipase 36 11 - 51 U/L  Comprehensive metabolic panel     Status: Abnormal   Collection Time: 01/04/20  7:08 AM  Result Value Ref Range   Sodium 141 135 - 145 mmol/L   Potassium 4.2 3.5 - 5.1 mmol/L   Chloride 107 98 - 111 mmol/L   CO2 26 22 - 32 mmol/L   Glucose, Bld 98 70 - 99 mg/dL   BUN 16 6 - 20 mg/dL   Creatinine, Ser 6.75 0.44 - 1.00 mg/dL   Calcium 9.2 8.9 - 91.6 mg/dL   Total Protein 7.6 6.5 - 8.1 g/dL   Albumin 4.2 3.5 - 5.0 g/dL   AST 14 (L) 15 - 41 U/L   ALT 10 0 - 44 U/L   Alkaline Phosphatase 63 38 - 126 U/L   Total Bilirubin 0.9 0.3 - 1.2 mg/dL   GFR, Estimated >38 >46 mL/min   Anion gap 8 5 - 15  CBC     Status: None   Collection Time: 01/04/20  7:08 AM  Result Value Ref Range   WBC 5.9 4.0 - 10.5 K/uL   RBC 4.18 3.87 - 5.11 MIL/uL   Hemoglobin 12.7 12.0 - 15.0 g/dL   HCT 65.9 36 - 46 %   MCV 93.8 80.0 - 100.0 fL   MCH 30.4 26.0 - 34.0 pg   MCHC 32.4 30.0 - 36.0 g/dL   RDW 93.5 70.1 - 77.9 %   Platelets 310 150 - 400 K/uL   nRBC 0.0 0.0 - 0.2 %  Urinalysis, Complete w Microscopic Urine, Clean Catch     Status: Abnormal   Collection Time: 01/04/20  7:08 AM  Result Value Ref Range   Color, Urine AMBER (A) YELLOW   APPearance TURBID (A) CLEAR   Specific Gravity, Urine  1.021 1.005 - 1.030   pH 5.0 5.0 - 8.0   Glucose, UA NEGATIVE NEGATIVE mg/dL   Hgb urine dipstick LARGE (A) NEGATIVE   Bilirubin Urine NEGATIVE NEGATIVE   Ketones, ur NEGATIVE NEGATIVE mg/dL   Protein, ur 390 (A) NEGATIVE mg/dL   Nitrite NEGATIVE NEGATIVE   Leukocytes,Ua LARGE (A) NEGATIVE   RBC / HPF >50 (H) 0 - 5 RBC/hpf   WBC, UA >50 (H) 0 - 5 WBC/hpf   Bacteria, UA  RARE (A) NONE SEEN   Squamous Epithelial / LPF 21-50 0 - 5   Budding Yeast PRESENT   POC urine preg, ED     Status: None   Collection Time: 01/04/20  8:33 AM  Result Value Ref Range   Preg Test, Ur NEGATIVE NEGATIVE   ____________________________________________ ____________________________________________  RADIOLOGY  I personally reviewed all radiographic images ordered to evaluate for the above acute complaints and reviewed radiology reports and findings.  These findings were personally discussed with the patient.  Please see medical record for radiology report.  ____________________________________________   PROCEDURES  Procedure(s) performed:  Procedures    Critical Care performed: no ____________________________________________   INITIAL IMPRESSION / ASSESSMENT AND PLAN / ED COURSE  Pertinent labs & imaging results that were available during my care of the patient were reviewed by me and considered in my medical decision making (see chart for details).   DDX: Stone, appendicitis, colitis, diverticulitis, musculoskeletal strain, ovarian cyst  Fate Galanti is a 49 y.o. who presents to the ED with presentation as described above.  Patient very uncomfortable appearing will give IV narcotic medications with IV fluids and IV antiemetic.  Will order CT imaging to evaluate for above differential.  Clinical Course as of Jan 03 1033  Sat Jan 04, 2020  4970 My review of CT imaging appears the patient has evidence of right ureterolithiasis with some hydro-.  Will give IV Toradol.   [PR]  1001  Patient feels improved after Toradol.  Symptoms are secondary to obstructing stone and some mild hydro-.  She is not describing any symptoms of dysuria no fevers.  No white count.  Urine does not appear grossly infected and appears consistent with stone.  Will discuss with urologist and help arrange close outpatient follow-up. Discussed strict return precautions.    [PR]    Clinical Course User Index [PR] Willy Eddy, MD    The patient was evaluated in Emergency Department today for the symptoms described in the history of present illness. He/she was evaluated in the context of the global COVID-19 pandemic, which necessitated consideration that the patient might be at risk for infection with the SARS-CoV-2 virus that causes COVID-19. Institutional protocols and algorithms that pertain to the evaluation of patients at risk for COVID-19 are in a state of rapid change based on information released by regulatory bodies including the CDC and federal and state organizations. These policies and algorithms were followed during the patient's care in the ED.  As part of my medical decision making, I reviewed the following data within the electronic MEDICAL RECORD NUMBER Nursing notes reviewed and incorporated, Labs reviewed, notes from prior ED visits and [place] Controlled Substance Database   ____________________________________________   FINAL CLINICAL IMPRESSION(S) / ED DIAGNOSES  Final diagnoses:  Right flank pain  Kidney stone      NEW MEDICATIONS STARTED DURING THIS VISIT:  New Prescriptions   HYDROCODONE-ACETAMINOPHEN (NORCO) 5-325 MG TABLET    Take 1 tablet by mouth every 4 (four) hours as needed for moderate pain.   ONDANSETRON (ZOFRAN) 4 MG TABLET    Take 1 tablet (4 mg total) by mouth daily as needed.   TAMSULOSIN (FLOMAX) 0.4 MG CAPS CAPSULE    Take 1 capsule (0.4 mg total) by mouth daily after supper.     Note:  This document was prepared using Dragon voice recognition software and  may include unintentional dictation errors.    Willy Eddy, MD 01/04/20 [phone]

## 2020-01-04 NOTE — ED Notes (Signed)
Pt in CT at this time.

## 2020-01-04 NOTE — ED Triage Notes (Signed)
PT ambulatory to triage 1 with c/o RLQ abdominal pain that radiates into right flank.  Pt states hx of kidney stones, but this feels different.  Pt states pain started two days ago and then subsided, but returned this AM.  Pt denies n/v, urinary symptoms.

## 2020-01-06 ENCOUNTER — Telehealth: Payer: Self-pay | Admitting: Urology

## 2020-01-06 NOTE — Telephone Encounter (Signed)
Stone patient in ER over weekend, contacted by ER physician (not seen).  This patient needs follow-up early this week with any MD. May be candidate for shockwave lithotripsy Thursday.   Vanna Scotland, MD

## 2020-01-07 ENCOUNTER — Ambulatory Visit: Payer: BC Managed Care – PPO | Admitting: Registered Nurse

## 2020-01-07 ENCOUNTER — Other Ambulatory Visit
Admission: RE | Admit: 2020-01-07 | Discharge: 2020-01-07 | Disposition: A | Discharge status: Home / Self Care | Payer: BC Managed Care – PPO | Source: Ambulatory Visit | Attending: Urology | Admitting: Urology

## 2020-01-07 ENCOUNTER — Other Ambulatory Visit: Payer: Self-pay

## 2020-01-07 ENCOUNTER — Ambulatory Visit (INDEPENDENT_AMBULATORY_CARE_PROVIDER_SITE_OTHER): Payer: BC Managed Care – PPO | Admitting: Urology

## 2020-01-07 ENCOUNTER — Encounter: Payer: Self-pay | Admitting: Urology

## 2020-01-07 ENCOUNTER — Ambulatory Visit
Admission: RE | Admit: 2020-01-07 | Discharge: 2020-01-07 | Disposition: A | Discharge status: Home / Self Care | Payer: BC Managed Care – PPO | Source: Ambulatory Visit | Attending: Urology | Admitting: Urology

## 2020-01-07 ENCOUNTER — Ambulatory Visit: Payer: BC Managed Care – PPO

## 2020-01-07 ENCOUNTER — Encounter
Admission: RE | Disposition: A | Discharge status: Home / Self Care | Payer: Self-pay | Source: Ambulatory Visit | Attending: Urology

## 2020-01-07 VITALS — BP 138/82 | HR 81

## 2020-01-07 DIAGNOSIS — N201 Calculus of ureter: Secondary | ICD-10-CM

## 2020-01-07 DIAGNOSIS — R109 Unspecified abdominal pain: Secondary | ICD-10-CM | POA: Diagnosis not present

## 2020-01-07 DIAGNOSIS — Z20822 Contact with and (suspected) exposure to covid-19: Secondary | ICD-10-CM | POA: Insufficient documentation

## 2020-01-07 DIAGNOSIS — Z87442 Personal history of urinary calculi: Secondary | ICD-10-CM | POA: Diagnosis not present

## 2020-01-07 DIAGNOSIS — N202 Calculus of kidney with calculus of ureter: Secondary | ICD-10-CM | POA: Diagnosis not present

## 2020-01-07 HISTORY — PX: CYSTOSCOPY/URETEROSCOPY/HOLMIUM LASER/STENT PLACEMENT: SHX6546

## 2020-01-07 LAB — MICROSCOPIC EXAMINATION
Epithelial Cells (non renal): >10 /hpf — AB (ref 0–10)
RBC, Urine: >30 /hpf — AB (ref 0–2)

## 2020-01-07 LAB — URINALYSIS, COMPLETE
Bilirubin, UA: NEGATIVE
Glucose, UA: NEGATIVE
Ketones, UA: NEGATIVE
Nitrite, UA: NEGATIVE
Specific Gravity, UA: 1.025 (ref 1.005–1.030)
Urobilinogen, Ur: 0.2 mg/dL (ref 0.2–1.0)
pH, UA: 5.5 (ref 5.0–7.5)

## 2020-01-07 LAB — SARS CORONAVIRUS 2 BY RT PCR (HOSPITAL ORDER, PERFORMED IN ~~LOC~~ HOSPITAL LAB): SARS Coronavirus 2: NEGATIVE

## 2020-01-07 SURGERY — CYSTOSCOPY/URETEROSCOPY/HOLMIUM LASER/STENT PLACEMENT
Anesthesia: General | Laterality: Right

## 2020-01-07 MED ORDER — PROPOFOL 10 MG/ML IV BOLUS
INTRAVENOUS | Status: AC
  Filled 2020-01-07: qty 20

## 2020-01-07 MED ORDER — PROMETHAZINE HCL 25 MG/ML IJ SOLN
INTRAMUSCULAR | Status: AC
  Filled 2020-01-07: qty 1

## 2020-01-07 MED ORDER — LIDOCAINE HCL (CARDIAC) PF 100 MG/5ML IV SOSY
PREFILLED_SYRINGE | INTRAVENOUS | Status: DC | PRN
  Administered 2020-01-07: 80 mg via INTRAVENOUS

## 2020-01-07 MED ORDER — EPHEDRINE SULFATE 50 MG/ML IJ SOLN
INTRAMUSCULAR | Status: DC | PRN
  Administered 2020-01-07: 5 mg via INTRAVENOUS
  Administered 2020-01-07: 10 mg via INTRAVENOUS

## 2020-01-07 MED ORDER — LACTATED RINGERS IV SOLN: INTRAVENOUS | Status: DC

## 2020-01-07 MED ORDER — FENTANYL CITRATE (PF) 100 MCG/2ML IJ SOLN
INTRAMUSCULAR | Status: DC | PRN
  Administered 2020-01-07 (×4): 25 ug via INTRAVENOUS

## 2020-01-07 MED ORDER — EPHEDRINE 5 MG/ML INJ
INTRAVENOUS | Status: AC
  Filled 2020-01-07: qty 10

## 2020-01-07 MED ORDER — PROMETHAZINE HCL 25 MG/ML IJ SOLN
6.2500 mg | INTRAMUSCULAR | Status: DC | PRN
  Administered 2020-01-07: 6.25 mg via INTRAVENOUS

## 2020-01-07 MED ORDER — ONDANSETRON HCL 4 MG/2ML IJ SOLN
INTRAMUSCULAR | Status: DC | PRN
  Administered 2020-01-07: 4 mg via INTRAVENOUS

## 2020-01-07 MED ORDER — ONDANSETRON HCL 4 MG/2ML IJ SOLN
INTRAMUSCULAR | Status: AC
  Filled 2020-01-07: qty 2

## 2020-01-07 MED ORDER — CHLORHEXIDINE GLUCONATE 0.12 % MT SOLN
15.0000 mL | Freq: Once | OROMUCOSAL | Status: AC
  Administered 2020-01-07: 15 mL via OROMUCOSAL

## 2020-01-07 MED ORDER — IOPAMIDOL (ISOVUE-200) INJECTION 41%
INTRAVENOUS | Status: DC | PRN
  Administered 2020-01-07: 9 mL via INTRAVENOUS

## 2020-01-07 MED ORDER — FENTANYL CITRATE (PF) 100 MCG/2ML IJ SOLN
INTRAMUSCULAR | Status: AC
  Filled 2020-01-07: qty 2

## 2020-01-07 MED ORDER — MIDAZOLAM HCL 2 MG/2ML IJ SOLN
INTRAMUSCULAR | Status: AC
  Filled 2020-01-07: qty 2

## 2020-01-07 MED ORDER — MIDAZOLAM HCL 2 MG/2ML IJ SOLN
INTRAMUSCULAR | Status: DC | PRN
  Administered 2020-01-07: 2 mg via INTRAVENOUS

## 2020-01-07 MED ORDER — PROPOFOL 10 MG/ML IV BOLUS
INTRAVENOUS | Status: DC | PRN
  Administered 2020-01-07: 150 mg via INTRAVENOUS
  Administered 2020-01-07: 50 mg via INTRAVENOUS
  Administered 2020-01-07: 30 mg via INTRAVENOUS

## 2020-01-07 MED ORDER — ACETAMINOPHEN 10 MG/ML IV SOLN
INTRAVENOUS | Status: AC
  Filled 2020-01-07: qty 100

## 2020-01-07 MED ORDER — ACETAMINOPHEN 10 MG/ML IV SOLN
INTRAVENOUS | Status: DC | PRN
  Administered 2020-01-07: 1000 mg via INTRAVENOUS

## 2020-01-07 MED ORDER — FENTANYL CITRATE (PF) 100 MCG/2ML IJ SOLN: 25.0000 ug | INTRAMUSCULAR | Status: DC | PRN

## 2020-01-07 MED ORDER — ORAL CARE MOUTH RINSE: 15.0000 mL | Freq: Once | OROMUCOSAL | Status: AC

## 2020-01-07 MED ORDER — OXYBUTYNIN CHLORIDE 5 MG PO TABS: ORAL_TABLET | ORAL | 0 refills | Status: DC

## 2020-01-07 MED ORDER — CEPHALEXIN 500 MG PO CAPS: 500.0000 mg | ORAL_CAPSULE | Freq: Three times a day (TID) | ORAL | 0 refills | Status: DC

## 2020-01-07 MED ORDER — DEXAMETHASONE SODIUM PHOSPHATE 10 MG/ML IJ SOLN
INTRAMUSCULAR | Status: DC | PRN
  Administered 2020-01-07: 10 mg via INTRAVENOUS

## 2020-01-07 MED ORDER — SODIUM CHLORIDE 0.9 % IV SOLN
1.0000 g | INTRAVENOUS | Status: AC
  Administered 2020-01-07: 1 g via INTRAVENOUS
  Filled 2020-01-07: qty 1

## 2020-01-07 MED ORDER — DEXAMETHASONE SODIUM PHOSPHATE 10 MG/ML IJ SOLN
INTRAMUSCULAR | Status: AC
  Filled 2020-01-07: qty 1

## 2020-01-07 MED ORDER — SODIUM CHLORIDE FLUSH 0.9 % IV SOLN
INTRAVENOUS | Status: AC
  Filled 2020-01-07: qty 10

## 2020-01-07 SURGICAL SUPPLY — 33 items
BAG DRAIN CYSTO-URO LG1000N (MISCELLANEOUS) ×1 IMPLANT
BASKET ZERO TIP 1.9FR (BASKET) ×4 IMPLANT
BRUSH SCRUB EZ 1% IODOPHOR (MISCELLANEOUS) ×1 IMPLANT
BSKT STON RTRVL ZERO TP 1.9FR (BASKET) ×2
CATH URETL 5X70 OPEN END (CATHETERS) ×2 IMPLANT
CNTNR SPEC 2.5X3XGRAD LEK (MISCELLANEOUS)
CONT SPEC 4OZ STER OR WHT (MISCELLANEOUS)
CONT SPEC 4OZ STRL OR WHT (MISCELLANEOUS)
CONTAINER SPEC 2.5X3XGRAD LEK (MISCELLANEOUS) IMPLANT
DRAPE UTILITY 15X26 TOWEL STRL (DRAPES) ×3 IMPLANT
GLOVE BIOGEL PI IND STRL 7.5 (GLOVE) ×1 IMPLANT
GLOVE BIOGEL PI INDICATOR 7.5 (GLOVE) ×2
GOWN STRL REUS W/ TWL LRG LVL3 (GOWN DISPOSABLE) ×1 IMPLANT
GOWN STRL REUS W/ TWL XL LVL3 (GOWN DISPOSABLE) ×1 IMPLANT
GOWN STRL REUS W/TWL LRG LVL3 (GOWN DISPOSABLE) ×3
GOWN STRL REUS W/TWL XL LVL3 (GOWN DISPOSABLE) ×3
GUIDEWIRE STR DUAL SENSOR (WIRE) ×5 IMPLANT
INFUSOR MANOMETER BAG 3000ML (MISCELLANEOUS) ×3 IMPLANT
INTRODUCER DILATOR DOUBLE (INTRODUCER) IMPLANT
KIT TURNOVER CYSTO (KITS) ×3 IMPLANT
MANIFOLD NEPTUNE II (INSTRUMENTS) ×1 IMPLANT
PACK CYSTO AR (MISCELLANEOUS) ×3 IMPLANT
SET CYSTO W/LG BORE CLAMP LF (SET/KITS/TRAYS/PACK) ×3 IMPLANT
SHEATH URETERAL 12FRX35CM (MISCELLANEOUS) ×2 IMPLANT
SOL .9 NS 3000ML IRR  AL (IV SOLUTION) ×3
SOL .9 NS 3000ML IRR AL (IV SOLUTION) ×1
SOL .9 NS 3000ML IRR UROMATIC (IV SOLUTION) ×1 IMPLANT
STENT URET 6FRX24 CONTOUR (STENTS) ×2 IMPLANT
STENT URET 6FRX26 CONTOUR (STENTS) IMPLANT
SURGILUBE 2OZ TUBE FLIPTOP (MISCELLANEOUS) ×3 IMPLANT
TRACTIP FLEXIVA PULSE ID 200 (Laser) ×3 IMPLANT
VALVE UROSEAL ADJ ENDO (VALVE) IMPLANT
WATER STERILE IRR 1000ML POUR (IV SOLUTION) ×3 IMPLANT

## 2020-01-07 NOTE — Anesthesia Procedure Notes (Signed)
Procedure Name: LMA Insertion Date/Time: 01/07/2020 4:40 PM Performed by: Karoline Caldwell, CRNA Pre-anesthesia Checklist: Patient identified, Emergency Drugs available, Suction available and Patient being monitored Patient Re-evaluated:Patient Re-evaluated prior to induction Oxygen Delivery Method: Circle system utilized Preoxygenation: Pre-oxygenation with 100% oxygen Induction Type: IV induction Ventilation: Mask ventilation without difficulty LMA: LMA inserted LMA Size: 3.5 Number of attempts: 1 Placement Confirmation: positive ETCO2 and breath sounds checked- equal and bilateral Tube secured with: Tape Dental Injury: Teeth and Oropharynx as per pre-operative assessment

## 2020-01-07 NOTE — Anesthesia Postprocedure Evaluation (Signed)
Anesthesia Post Note  Patient: Susan Garner  Procedure(s) Performed: CYSTOSCOPY/URETEROSCOPY/HOLMIUM LASER/STENT PLACEMENT (Right )  Patient location during evaluation: PACU Anesthesia Type: General Level of consciousness: awake and alert Pain management: pain level controlled Vital Signs Assessment: post-procedure vital signs reviewed and stable Respiratory status: spontaneous breathing, nonlabored ventilation, respiratory function stable and patient connected to nasal cannula oxygen Cardiovascular status: blood pressure returned to baseline and stable Postop Assessment: no apparent nausea or vomiting Anesthetic complications: no   No complications documented.   Last Vitals:  Vitals:   01/07/20 1836 01/07/20 1900  BP: (!) 149/91 (!) 143/86  Pulse: (!) 111 97  Resp: 18 16  Temp:  36.7 C  SpO2: 100% 100%    Last Pain:  Vitals:   01/07/20 1900  TempSrc:   PainSc: 0-No pain                 Lenard Simmer

## 2020-01-07 NOTE — Anesthesia Preprocedure Evaluation (Signed)
Anesthesia Evaluation  Patient identified by MRN, date of birth, ID band Patient awake    Reviewed: Allergy & Precautions, H&P , NPO status , Patient's Chart, lab work & pertinent test results, reviewed documented beta blocker date and time   Airway Mallampati: I  TM Distance: >3 FB Neck ROM: full    Dental  (+) Dental Advidsory Given, Teeth Intact   Pulmonary neg pulmonary ROS,    Pulmonary exam normal breath sounds clear to auscultation       Cardiovascular Exercise Tolerance: Good negative cardio ROS Normal cardiovascular exam Rhythm:regular Rate:Normal     Neuro/Psych PSYCHIATRIC DISORDERS Anxiety Depression negative neurological ROS     GI/Hepatic Neg liver ROS, GERD  ,  Endo/Other  negative endocrine ROS  Renal/GU Renal disease (kidney stones)  negative genitourinary   Musculoskeletal   Abdominal   Peds  Hematology negative hematology ROS (+)   Anesthesia Other Findings Past Medical History: No date: Allergy No date: Anxiety No date: Depression No date: Headache No date: Kidney stones No date: Ovarian cyst   Reproductive/Obstetrics negative OB ROS                             Anesthesia Physical Anesthesia Plan  ASA: II  Anesthesia Plan: General   Post-op Pain Management:    Induction:   PONV Risk Score and Plan: 3 and Ondansetron, Dexamethasone, Midazolam, Promethazine and Treatment may vary due to age or medical condition  Airway Management Planned:   Additional Equipment:   Intra-op Plan:   Post-operative Plan:   Informed Consent: I have reviewed the patients History and Physical, chart, labs and discussed the procedure including the risks, benefits and alternatives for the proposed anesthesia with the patient or authorized representative who has indicated his/her understanding and acceptance.     Dental Advisory Given  Plan Discussed with:  Anesthesiologist, CRNA and Surgeon  Anesthesia Plan Comments:         Anesthesia Quick Evaluation

## 2020-01-07 NOTE — H&P (View-Only) (Signed)
01/07/2020 12:10 PM   [name] [birth year] [id number]  Referring provider: [name], PA-C [address] [place],  [place] [address]  Chief Complaint  Patient presents with  . Nephrolithiasis    HPI: 49 year old female with personal stream nephrolithiasis who presents today for evaluation of an 8 mm right proximal ureteral calculus.  She presented to the ER over the weekend on 01/04/2020 with 2 days of acute severe right flank pain, associated nausea and vomiting.  No fevers or chills.  Urinalysis at the time showed greater than 50 red blood cells as well as greater than 50 white blood cells with rare bacteria with multiple squamous epithelial cells as well as yeast.  Unfortunately, no culture was sent.  She had no leukocytosis, normal creatinine and vitals are stable.  CT scan revealed an 8 mm right proximal ureteral calculus with proximal hydroureteronephrosis.  She continues to have severe discomfort. Ultimately in the ER her pain was able to be controlled with Toradol. She is continue to take narcotics and Zofran for nausea.  She doesn't think that she can wait to have intervention.  She does mention today that she has some urethral discharge which is foul-smelling. No significant dysuria. No fevers or chills.  She does have a personal history of nephrolithiasis.  She was seen by me back in 2017 with an obstructing stone.  This is also in her right proximal ureter, at the time was 5 mm.  She never followed up with me but I did receive a stone analysis presumably she had passed the stone in the interim.   PMH: Past Medical History:  Diagnosis Date  . Allergy   . Anxiety   . Depression   . Headache   . Kidney stones   . Ovarian cyst     Surgical History: Past Surgical History:  Procedure Laterality Date  . FEMUR FRACTURE SURGERY Right 1980's  . TONSILLECTOMY    . TUBAL LIGATION      Home Medications:  Allergies as of 01/07/2020     No Known Allergies     Medication List       Accurate as of January 07, 2020 12:10 PM. If you have any questions, ask your nurse or doctor.        HYDROcodone-acetaminophen 5-325 MG tablet Commonly known as: Norco Take 1 tablet by mouth every 4 (four) hours as needed for moderate pain.   ondansetron 4 MG tablet Commonly known as: Zofran Take 1 tablet (4 mg total) by mouth daily as needed.   tamsulosin 0.4 MG Caps capsule Commonly known as: FLOMAX Take 1 capsule (0.4 mg total) by mouth daily after supper.   venlafaxine XR 150 MG 24 hr capsule Commonly known as: EFFEXOR-XR TAKE 1 CAPSULE (150 MG TOTAL) BY MOUTH DAILY WITH BREAKFAST.       Allergies: No Known Allergies  Family History: Family History  Problem Relation Age of Onset  . Anxiety disorder Mother   . Cancer Neg Hx   . Diabetes Neg Hx   . Heart disease Neg Hx   . Breast cancer Neg Hx     Social History:  reports that she has never smoked. She has never used smokeless tobacco. She reports that she does not drink alcohol and does not use drugs.   Physical Exam: BP 138/82   Pulse 81   Constitutional:  Alert and oriented, No acute distress. HEENT: [place] AT, moist mucus membranes.  Trachea midline, no masses. Cardiovascular: No  clubbing, cyanosis, or edema. Respiratory: Normal respiratory effort, no increased work of breathing. GI: Abdomen is soft, nontender, nondistended, no abdominal masses GU: + R CVA tenderness Skin: No rashes, bruises or suspicious lesions. Neurologic: Grossly intact, no focal deficits, moving all 4 extremities. Psychiatric: Normal mood and affect.  Laboratory Data: Lab Results  Component Value Date   WBC 5.9 01/04/2020   HGB 12.7 01/04/2020   HCT 39.2 01/04/2020   MCV 93.8 01/04/2020   PLT 310 01/04/2020    Lab Results  Component Value Date   CREATININE 0.87 01/04/2020    Lab Results  Component Value Date   HGBA1C 5.3 11/25/2014    Urinalysis UA today with greater  than 30 red blood cells, 6-10 white blood cells, greater than 10 epithelial cells, few bacteria, nitrate negative.  Pertinent Imaging: CLINICAL DATA:  Abdominal pain, primarily right cited  EXAM: CT ABDOMEN AND PELVIS WITH CONTRAST  TECHNIQUE: Multidetector CT imaging of the abdomen and pelvis was performed using the standard protocol following bolus administration of intravenous contrast.  CONTRAST:  OMNIPAQUE IOHEXOL 300 MG/ML  SOLN  COMPARISON:  Jul 10, 2006  FINDINGS: Lower chest: Lung bases are clear.  Hepatobiliary: There are scattered cysts in the liver, with largest cyst in the right lobe measuring 1.7 x 1.4 cm and largest cyst in the left lobe measuring 1.9 x 1.8 cm. No noncystic liver lesions are evident. There is slight fatty infiltration near the fissure for the ligamentum teres. Gallbladder wall is not appreciably thickened. There is no biliary duct dilatation.  Pancreas: There is no pancreatic mass or inflammatory focus.  Spleen: No splenic lesions are evident.  Adrenals/Urinary Tract: Adrenals bilaterally appear normal. There is an extrarenal pelvis on the right. There is subtle right renal edema. No renal masses evident on either side. There is mild hydronephrosis on the right. There is no hydronephrosis on the left. There is no intrarenal calculus on the left. There is a focal calculus immediately distal to the right ureteropelvic junction measuring 8 x 6 mm. No other ureteral calculi are evident on either side. Urinary bladder is midline with wall thickness within normal limits.  Stomach/Bowel: There is no appreciable bowel wall or mesenteric thickening. There is moderate stool throughout colon. The terminal ileum appears normal. There is lipomatous infiltration in the ileocecal valve. There is no evident bowel obstruction. There is no free air or portal venous air.  Vascular/Lymphatic: There is no abdominal aortic aneurysm.  No arterial vascular lesions are evident. Major venous structures appear patent. There is no adenopathy abdomen or pelvis. Note that there is a retroaortic left renal vein, an anatomic variant.  Reproductive: Uterus is anteverted. There is a 1.2 x 1.2 cm follicle in the left ovary. No other adnexal mass evident.  Other: Appendix appears normal. No evident abscess or ascites in the abdomen or pelvis. There is trace fat in the umbilicus.  Musculoskeletal: There is degenerative change with vacuum phenomenon at L5-S1. There are no blastic or lytic bone lesions. No intramuscular lesions are appreciable.  IMPRESSION: 1. 8 x 6 mm calculus in the proximal right ureter slightly beyond the right ureteropelvic junction with mild hydronephrosis on the right and subtle right renal edema.  2. No bowel wall thickening or bowel obstruction. No abscess in the abdomen or pelvis. Appendix appears normal.   Electronically Signed   By: Bretta Bang III M.D.   On: 01/04/2020 09:35  CT scan personally reviewed. Agree with radiologic interpretation. Approximately 808 Hounsfield units.  Assessment & Plan:    1. Right ureteral stone Right proximal ureteral calculus, 8 mm which is a fairly large stone with poorly controlled pain  Her urinalysis today is somewhat contaminated in appearance and does not appear to be grossly infected  Urine culture ordered, pending  We discussed various treatment options for urolithiasis including observation with or without medical expulsive therapy, shockwave lithotripsy (SWL), ureteroscopy and laser lithotripsy with stent placement, and percutaneous nephrolithotomy.   We discussed that management is based on stone size, location, density, patient co-morbidities, and patient preference.    Stones <17mm in size have a >80% spontaneous passage rate. Data surrounding the use of tamsulosin for medical expulsive therapy is controversial, but meta analyses  suggests it is most efficacious for distal stones between 5-48mm in size. Possible side effects include dizziness/lightheadedness, and retrograde ejaculation.   SWL has a lower stone free rate in a single procedure, but also a lower complication rate compared to ureteroscopy and avoids a stent and associated stent related symptoms. Possible complications include renal hematoma, steinstrasse, and need for additional treatment. We discussed the role of his increased skin to stone distance can lead to decreased efficacy with shockwave lithotripsy.   Ureteroscopy with laser lithotripsy and stent placement has a higher stone free rate than SWL in a single procedure, however increased complication rate including possible infection, ureteral injury, bleeding, and stent related morbidity. Common stent related symptoms include dysuria, urgency/frequency, and flank pain.   After an extensive discussion of the risks and benefits of the above treatment options, the patient would like to proceed with attempted stent/possible ureteroscopy today given that she doesn't think that she can wait until Thursday.  I was able to add her emergently onto the operating room today due to refractory poorly controlled pain. Case was discussed with Dr. Lonna Cobb. We'll plan for stent versus ureteroscopy depending on intraoperative factors.  Covid swab.  - Urinalysis, Complete  2. Right flank pain As above   Vanna Scotland, MD  [facility] [address], [address] [place], [place] [address] [phone]

## 2020-01-07 NOTE — Discharge Instructions (Signed)
AMBULATORY SURGERY  DISCHARGE INSTRUCTIONS   1) The drugs that you were given will stay in your system until tomorrow so for the next 24 hours you should not:  A) Drive an automobile B) Make any legal decisions C) Drink any alcoholic beverage   2) You may resume regular meals tomorrow.  Today it is better to start with liquids and gradually work up to solid foods.  You may eat anything you prefer, but it is better to start with liquids, then soup and crackers, and gradually work up to solid foods.   3) Please notify your doctor immediately if you have any unusual bleeding, trouble breathing, redness and pain at the surgery site, drainage, fever, or pain not relieved by medication.    4) Additional Instructions:        Please contact your physician with any problems or Same Day Surgery at [phone], Monday through Friday 6 am to 4 pm, or [place] at [facility] number at [phone].DISCHARGE INSTRUCTIONS FOR KIDNEY STONE/URETERAL STENT   MEDICATIONS:  1. Resume all your other meds from home.  2.  AZO (over-the-counter) can help with the burning/stinging when you urinate. 3.  Oxybutynin is for stent/bladder irritation, Rx was sent to your pharmacy.  ACTIVITY:  1. May resume regular activities in 24 hours. 2. No driving while on narcotic pain medications  3. Drink plenty of water  4. Continue to walk at home - you can still get blood clots when you are at home, so keep active, but don't over do it.  5. May return to work/school tomorrow or when you feel ready    SIGNS/SYMPTOMS TO CALL:  Please call us if you have a fever greater than 101.5, uncontrolled nausea/vomiting, uncontrolled pain, dizziness, unable to urinate, excessively bloody urine, chest pain, shortness of breath, leg swelling, leg pain, or any other concerns or questions.   Common postoperative symptoms include urinary frequency, urgency, blood in the urine and bladder spasm.  You can reach [place] at  [phone].   FOLLOW-UP:  1. You will be contacted for follow-up appointment for stent removal to be performed on Tuesday 11/23   AMBULATORY SURGERY  DISCHARGE INSTRUCTIONS   5) The drugs that you were given will stay in your system until tomorrow so for the next 24 hours you should not:  D) Drive an automobile E) Make any legal decisions F) Drink any alcoholic beverage   6) You may resume regular meals tomorrow.  Today it is better to start with liquids and gradually work up to solid foods.  You may eat anything you prefer, but it is better to start with liquids, then soup and crackers, and gradually work up to solid foods.   7) Please notify your doctor immediately if you have any unusual bleeding, trouble breathing, redness and pain at the surgery site, drainage, fever, or pain not relieved by medication.    8) Additional Instructions:        Please contact your physician with any problems or Same Day Surgery at [phone], Monday through Friday 6 am to 4 pm, or [place] at [facility] number at [phone].

## 2020-01-07 NOTE — Interval H&P Note (Signed)
History and Physical Interval Note:  01/07/2020 4:28 PM  Susan Garner  has presented today for surgery, with the diagnosis of Right Ureteral calculas.  The various methods of treatment have been discussed with the patient and family. After consideration of risks, benefits and other options for treatment, the patient has consented to  Procedure(s): CYSTOSCOPY/URETEROSCOPY/HOLMIUM LASER/STENT PLACEMENT (Right) as a surgical intervention.  The patient's history has been reviewed, patient examined, no change in status, stable for surgery.  I have reviewed the patient's chart and labs.  Questions were answered to the patient's satisfaction.     Kedrick Mcnamee C Ketara Cavness

## 2020-01-07 NOTE — Op Note (Signed)
Preoperative diagnosis:  1.  Right proximal ureteral calculus 2.  Intractable renal colic  Postoperative diagnosis:  Same  Procedure:  1. Cystoscopy 2. Right ureteroscopy and stone removal 3. Ureteroscopic laser lithotripsy 4. Right ureteral stent placement (6FR/24 cm)  5. Right retrograde pyelography with interpretation  Surgeon: Lorin Picket C. Lutricia Widjaja, M.D.  Anesthesia: General  Complications: None  Intraoperative findings:  1.  Cystoscopy-bladder mucosa normal in appearance without erythema, solid or papillary lesions 2.  Ureteropyeloscopy-right proximal ureteral calculus with ureteral edema 3.  Right retrograde pyelography post procedure showed no filling defects, stone fragments or contrast extravasation  EBL: Minimal  Specimens: 1. Calculus fragments for analysis   Indication: Susan Garner is a 49 y.o. with an 8 mm right proximal ureteral calculus and 2-day history of severe right flank pain with nausea and vomiting not responding to outpatient therapy.  Urinalysis in the ED showed >50 WBC/RBC however there were multiple squamous epithelial cells present and no urine culture was ordered.  Susan Garner has been afebrile and has no leukocytosis.  After reviewing the management options for treatment, the patient elected to proceed with the above surgical procedure(s). We have discussed the potential benefits and risks of the procedure, side effects of the proposed treatment, the likelihood of the patient achieving the goals of the procedure, and any potential problems that might occur during the procedure or recuperation. Informed consent has been obtained.  Description of procedure:  The patient was taken to the operating room and general anesthesia was induced.  The patient was placed in the dorsal lithotomy position, prepped and draped in the usual sterile fashion, and preoperative antibiotics were administered. A preoperative time-out was performed.   A 21 French cystoscope was  lubricated and passed passed per urethra.  Panendoscopy was performed with findings as described above.  Attention was directed to the right ureteral orifice and a 0.038 Sensor wire was then advanced up the ureter into the renal pelvis under fluoroscopic guidance.    A 4.5 French semirigid ureteroscope was passed per urethra and the scope was advanced into the right distal ureter without problems.  The ureteroscope was advanced proximally and the calculus was identified in the proximal ureter however the stone could not be adequately visualized for treatment.  The semirigid ureteroscope was removed and a digital single channel flexible ureteroscope was passed per urethra.  The distal ureter was engaged however the ureteroscope could not be advanced further.  A second Sensor wire was placed through the ureteroscope and advanced up the ureter and the scope was easily advanced over the wire through the mid ureter.  The guidewire was then removed and the ureteroscope was advanced proximally to the level of the stone.  The calculus was dusted with a 200 m holmium laser fiber at a setting of 0.2 J / 20 Hz with a second pedal set at 0.2/40 Hz.  A portion of the calculus fragmented and was placed in 1.9 French nitinol basket and was able to be removed however hung in the distal ureter and could not be disengaged.  The basket was disassembled and the flexible ureteroscope was removed.  The semirigid ureteroscope was repassed and the calculus was further fragmented and the basket and fragments were removed.  The semirigid ureteroscope was removed and the flexible ureteroscope was readvanced however there had been migration of the remaining fragment into the renal pelvis.  A second sensor wire was placed through the flexible ureteroscope and the scope was removed.  A 12/14 French ureteral  access sheath was placed over the working wire under fluoroscopic guidance without difficulty.  The flexible  ureteroscope was placed through the access sheath and advanced into the renal pelvis without difficulty.  Retrograde pyelogram was performed and all calyces were examined.  There were several minute fragments that had migrated from the proximal calculus.  A second fragment was identified in the lower pole calyx and placed within the basket and removed without difficulty.  The ureteroscope was readvanced and all calyces were examined and no significantly sized fragments were identified.  The ureteral access sheath and ureteroscope were removed in tandem and the ureter showed no evidence of injury or perforation.  A 6 FR/24 CM Contour ureteral stent was placed under fluoroscopic guidance.  The wire was then removed with an adequate stent curl noted in the renal pelvis as well as in the bladder.  The bladder was then emptied and the procedure ended.  The patient appeared to tolerate the procedure well and without complications.  After anesthetic reversal the patient was transported to the PACU in stable condition.   Plan:  Susan Garner will be scheduled for cystoscopy with stent removal Tuesday, 01/14/2020   Riki Altes, MD

## 2020-01-07 NOTE — Progress Notes (Signed)
01/07/2020 12:10 PM   Susan Garner Susan Garner [birth year] [id number]  Referring provider: [name], PA-C [address] [place],  [place] [address]  Chief Complaint  Patient presents with  . Nephrolithiasis    HPI: 49 year old female with personal stream nephrolithiasis who presents today for evaluation of an 8 mm right proximal ureteral calculus.  She presented to the ER over the weekend on 01/04/2020 with 2 days of acute severe right flank pain, associated nausea and vomiting.  No fevers or chills.  Urinalysis at the time showed greater than 50 red blood cells as well as greater than 50 white blood cells with rare bacteria with multiple squamous epithelial cells as well as yeast.  Unfortunately, no culture was sent.  She had no leukocytosis, normal creatinine and vitals are stable.  CT scan revealed an 8 mm right proximal ureteral calculus with proximal hydroureteronephrosis.  She continues to have severe discomfort. Ultimately in the ER her pain was able to be controlled with Toradol. She is continue to take narcotics and Zofran for nausea.  She doesn't think that she can wait to have intervention.  She does mention today that she has some urethral discharge which is foul-smelling. No significant dysuria. No fevers or chills.  She does have a personal history of nephrolithiasis.  She was seen by me back in 2017 with an obstructing stone.  This is also in her right proximal ureter, at the time was 5 mm.  She never followed up with me but I did receive a stone analysis presumably she had passed the stone in the interim.   PMH: Past Medical History:  Diagnosis Date  . Allergy   . Anxiety   . Depression   . Headache   . Kidney stones   . Ovarian cyst     Surgical History: Past Surgical History:  Procedure Laterality Date  . FEMUR FRACTURE SURGERY Right 1980's  . TONSILLECTOMY    . TUBAL LIGATION      Home Medications:  Allergies as of 01/07/2020     No Known Allergies     Medication List       Accurate as of January 07, 2020 12:10 PM. If you have any questions, ask your nurse or doctor.        HYDROcodone-acetaminophen 5-325 MG tablet Commonly known as: Norco Take 1 tablet by mouth every 4 (four) hours as needed for moderate pain.   ondansetron 4 MG tablet Commonly known as: Zofran Take 1 tablet (4 mg total) by mouth daily as needed.   tamsulosin 0.4 MG Caps capsule Commonly known as: FLOMAX Take 1 capsule (0.4 mg total) by mouth daily after supper.   venlafaxine XR 150 MG 24 hr capsule Commonly known as: EFFEXOR-XR TAKE 1 CAPSULE (150 MG TOTAL) BY MOUTH DAILY WITH BREAKFAST.       Allergies: No Known Allergies  Family History: Family History  Problem Relation Age of Onset  . Anxiety disorder Mother   . Cancer Neg Hx   . Diabetes Neg Hx   . Heart disease Neg Hx   . Breast cancer Neg Hx     Social History:  reports that she has never smoked. She has never used smokeless tobacco. She reports that she does not drink alcohol and does not use drugs.   Physical Exam: BP 138/82   Pulse 81   Constitutional:  Alert and oriented, No acute distress. HEENT: [place] AT, moist mucus membranes.  Trachea midline, no masses. Cardiovascular: No  clubbing, cyanosis, or edema. Respiratory: Normal respiratory effort, no increased work of breathing. GI: Abdomen is soft, nontender, nondistended, no abdominal masses GU: + R CVA tenderness Skin: No rashes, bruises or suspicious lesions. Neurologic: Grossly intact, no focal deficits, moving all 4 extremities. Psychiatric: Normal mood and affect.  Laboratory Data: Lab Results  Component Value Date   WBC 5.9 01/04/2020   HGB 12.7 01/04/2020   HCT 39.2 01/04/2020   MCV 93.8 01/04/2020   PLT 310 01/04/2020    Lab Results  Component Value Date   CREATININE 0.87 01/04/2020    Lab Results  Component Value Date   HGBA1C 5.3 11/25/2014    Urinalysis UA today with greater  than 30 red blood cells, 6-10 white blood cells, greater than 10 epithelial cells, few bacteria, nitrate negative.  Pertinent Imaging: CLINICAL DATA:  Abdominal pain, primarily right cited  EXAM: CT ABDOMEN AND PELVIS WITH CONTRAST  TECHNIQUE: Multidetector CT imaging of the abdomen and pelvis was performed using the standard protocol following bolus administration of intravenous contrast.  CONTRAST:  OMNIPAQUE IOHEXOL 300 MG/ML  SOLN  COMPARISON:  Jul 10, 2006  FINDINGS: Lower chest: Lung bases are clear.  Hepatobiliary: There are scattered cysts in the liver, with largest cyst in the right lobe measuring 1.7 x 1.4 cm and largest cyst in the left lobe measuring 1.9 x 1.8 cm. No noncystic liver lesions are evident. There is slight fatty infiltration near the fissure for the ligamentum teres. Gallbladder wall is not appreciably thickened. There is no biliary duct dilatation.  Pancreas: There is no pancreatic mass or inflammatory focus.  Spleen: No splenic lesions are evident.  Adrenals/Urinary Tract: Adrenals bilaterally appear normal. There is an extrarenal pelvis on the right. There is subtle right renal edema. No renal masses evident on either side. There is mild hydronephrosis on the right. There is no hydronephrosis on the left. There is no intrarenal calculus on the left. There is a focal calculus immediately distal to the right ureteropelvic junction measuring 8 x 6 mm. No other ureteral calculi are evident on either side. Urinary bladder is midline with wall thickness within normal limits.  Stomach/Bowel: There is no appreciable bowel wall or mesenteric thickening. There is moderate stool throughout colon. The terminal ileum appears normal. There is lipomatous infiltration in the ileocecal valve. There is no evident bowel obstruction. There is no free air or portal venous air.  Vascular/Lymphatic: There is no abdominal aortic aneurysm.  No arterial vascular lesions are evident. Major venous structures appear patent. There is no adenopathy abdomen or pelvis. Note that there is a retroaortic left renal vein, an anatomic variant.  Reproductive: Uterus is anteverted. There is a 1.2 x 1.2 cm follicle in the left ovary. No other adnexal mass evident.  Other: Appendix appears normal. No evident abscess or ascites in the abdomen or pelvis. There is trace fat in the umbilicus.  Musculoskeletal: There is degenerative change with vacuum phenomenon at L5-S1. There are no blastic or lytic bone lesions. No intramuscular lesions are appreciable.  IMPRESSION: 1. 8 x 6 mm calculus in the proximal right ureter slightly beyond the right ureteropelvic junction with mild hydronephrosis on the right and subtle right renal edema.  2. No bowel wall thickening or bowel obstruction. No abscess in the abdomen or pelvis. Appendix appears normal.   Electronically Signed   By: Bretta Bang III M.D.   On: 01/04/2020 09:35  CT scan personally reviewed. Agree with radiologic interpretation. Approximately 808 Hounsfield units.  Assessment & Plan:    1. Right ureteral stone Right proximal ureteral calculus, 8 mm which is a fairly large stone with poorly controlled pain  Her urinalysis today is somewhat contaminated in appearance and does not appear to be grossly infected  Urine culture ordered, pending  We discussed various treatment options for urolithiasis including observation with or without medical expulsive therapy, shockwave lithotripsy (SWL), ureteroscopy and laser lithotripsy with stent placement, and percutaneous nephrolithotomy.   We discussed that management is based on stone size, location, density, patient co-morbidities, and patient preference.    Stones <17mm in size have a >80% spontaneous passage rate. Data surrounding the use of tamsulosin for medical expulsive therapy is controversial, but meta analyses  suggests it is most efficacious for distal stones between 5-48mm in size. Possible side effects include dizziness/lightheadedness, and retrograde ejaculation.   SWL has a lower stone free rate in a single procedure, but also a lower complication rate compared to ureteroscopy and avoids a stent and associated stent related symptoms. Possible complications include renal hematoma, steinstrasse, and need for additional treatment. We discussed the role of his increased skin to stone distance can lead to decreased efficacy with shockwave lithotripsy.   Ureteroscopy with laser lithotripsy and stent placement has a higher stone free rate than SWL in a single procedure, however increased complication rate including possible infection, ureteral injury, bleeding, and stent related morbidity. Common stent related symptoms include dysuria, urgency/frequency, and flank pain.   After an extensive discussion of the risks and benefits of the above treatment options, the patient would like to proceed with attempted stent/possible ureteroscopy today given that she doesn't think that she can wait until Thursday.  I was able to add her emergently onto the operating room today due to refractory poorly controlled pain. Case was discussed with Dr. Lonna Cobb. We'll plan for stent versus ureteroscopy depending on intraoperative factors.  Covid swab.  - Urinalysis, Complete  2. Right flank pain As above   Vanna Scotland, MD  [facility] [address], [address] [place], [place] [address] [phone]

## 2020-01-07 NOTE — Transfer of Care (Signed)
Immediate Anesthesia Transfer of Care Note  Patient: Susan Garner  Procedure(s) Performed: CYSTOSCOPY/URETEROSCOPY/HOLMIUM LASER/STENT PLACEMENT (Right )  Patient Location: PACU  Anesthesia Type:General  Level of Consciousness: awake and drowsy  Airway & Oxygen Therapy: Patient Spontanous Breathing and Patient connected to face mask oxygen  Post-op Assessment: Report given to RN and Post -op Vital signs reviewed and stable  Post vital signs: Reviewed and stable  Last Vitals:  Vitals Value Taken Time  BP 156/92 01/07/20 1806  Temp    Pulse 122 01/07/20 1806  Resp 17 01/07/20 1806  SpO2 100 % 01/07/20 1806  Vitals shown include unvalidated device data.  Last Pain:  Vitals:   01/07/20 1509  TempSrc: Temporal  PainSc: 5          Complications: No complications documented.

## 2020-01-08 ENCOUNTER — Encounter: Payer: Self-pay | Admitting: Urology

## 2020-01-10 ENCOUNTER — Telehealth: Payer: Self-pay | Admitting: *Deleted

## 2020-01-10 LAB — CULTURE, URINE COMPREHENSIVE

## 2020-01-10 MED ORDER — SULFAMETHOXAZOLE-TRIMETHOPRIM 800-160 MG PO TABS: 1.0000 | ORAL_TABLET | Freq: Two times a day (BID) | ORAL | 0 refills | Status: DC

## 2020-01-10 NOTE — Telephone Encounter (Addendum)
Patient advised, sent in RX to [facility]. Voiced understanding.   ----- Message from Vanna Scotland, MD sent at 01/10/2020  7:57 AM EST ----- As a precaution, please treat with Bactrim DS twice daily for five days.  Vanna Scotland

## 2020-01-10 NOTE — Addendum Note (Signed)
Addended by: Milas Kocher A on: 01/10/2020 08:36 AM   Modules accepted: Orders

## 2020-01-13 LAB — CALCULI, WITH PHOTOGRAPH (CLINICAL LAB)
Calcium Oxalate Monohydrate: 70 %
Uric Acid Calculi: 30 %
Weight Calculi: 13 mg

## 2020-01-14 ENCOUNTER — Ambulatory Visit (INDEPENDENT_AMBULATORY_CARE_PROVIDER_SITE_OTHER): Payer: BC Managed Care – PPO | Admitting: Urology

## 2020-01-14 ENCOUNTER — Encounter: Payer: Self-pay | Admitting: Urology

## 2020-01-14 ENCOUNTER — Other Ambulatory Visit: Payer: Self-pay

## 2020-01-14 VITALS — BP 110/74 | HR 93 | Ht 70.0 in | Wt 170.0 lb

## 2020-01-14 DIAGNOSIS — N201 Calculus of ureter: Secondary | ICD-10-CM | POA: Diagnosis not present

## 2020-01-14 NOTE — Progress Notes (Signed)
Indications: Patient is 49 y.o., who is s/p ureteroscopic removal of an 8 mm right proximal ureteral calculus 01/07/2020.  The patient is presenting today for stent removal.  Procedure:  Flexible Cystoscopy with stent removal (03013)  Timeout was performed and the correct patient, procedure and participants were identified.    Description:  The patient was prepped and draped in the usual sterile fashion. Flexible cystosopy was performed.  The stent was visualized, grasped, and removed intact without difficulty. The patient tolerated the procedure well.  A single dose of oral antibiotics was given.  Complications:  None  Plan:   Instructed to call for fever/flank pain post stent removal  Stone analysis was 70% calcium oxalate monohydrate/30% uric acid  She is a recurrent stone former and metabolic evaluation was recommended  Follow-up in 3 months   Irineo Axon, MD

## 2020-01-14 NOTE — Patient Instructions (Signed)

## 2020-01-15 ENCOUNTER — Encounter: Payer: Self-pay | Admitting: Urology

## 2020-01-21 LAB — URINALYSIS, COMPLETE
Bilirubin, UA: NEGATIVE
Nitrite, UA: NEGATIVE
Specific Gravity, UA: >1.03 — ABNORMAL HIGH (ref 1.005–1.030)
Urobilinogen, Ur: 0.2 mg/dL (ref 0.2–1.0)
pH, UA: 5.5 (ref 5.0–7.5)

## 2020-01-21 LAB — MICROSCOPIC EXAMINATION: RBC, Urine: >30 /hpf — AB (ref 0–2)

## 2020-02-07 ENCOUNTER — Telehealth: Payer: Self-pay

## 2020-02-07 DIAGNOSIS — Z20828 Contact with and (suspected) exposure to other viral communicable diseases: Secondary | ICD-10-CM

## 2020-02-07 MED ORDER — OSELTAMIVIR PHOSPHATE 75 MG PO CAPS: 75.0000 mg | ORAL_CAPSULE | Freq: Every day | ORAL | 0 refills | Status: DC

## 2020-02-07 NOTE — Telephone Encounter (Signed)
Patient advised.

## 2020-02-07 NOTE — Telephone Encounter (Signed)
Copied from CRM [id number]. Topic: General - Other >> Feb 07, 2020 12:14 PM Randol Kern wrote: Reason for CRM: Pt is calling in a request of Tamiflu, pt went to the urgent care in [facility] and was told to contact her pcp because her daughter has tested positive for flu and covid. (Pt has known exposure) please advise   [id number] [place], [place] - [address] [place] [address] Phone: [phone] Fax: [phone]  Best contact: [phone]

## 2020-02-07 NOTE — Telephone Encounter (Signed)
Please Review

## 2020-02-07 NOTE — Telephone Encounter (Signed)
Tamiflu sent in. Since she has only been exposed and using as prophylaxis she will take 1 capsule daily x 10 days. If she develops symptoms, she would change to twice daily dosing.

## 2020-02-20 ENCOUNTER — Ambulatory Visit
Admission: EM | Admit: 2020-02-20 | Discharge: 2020-02-20 | Disposition: A | Discharge status: Home / Self Care | Payer: BC Managed Care – PPO | Attending: Physician Assistant | Admitting: Physician Assistant

## 2020-02-20 ENCOUNTER — Other Ambulatory Visit: Payer: Self-pay

## 2020-02-20 DIAGNOSIS — Z20822 Contact with and (suspected) exposure to covid-19: Secondary | ICD-10-CM | POA: Diagnosis not present

## 2020-02-20 DIAGNOSIS — B349 Viral infection, unspecified: Secondary | ICD-10-CM

## 2020-02-20 DIAGNOSIS — R059 Cough, unspecified: Secondary | ICD-10-CM | POA: Diagnosis not present

## 2020-02-20 DIAGNOSIS — R0981 Nasal congestion: Secondary | ICD-10-CM | POA: Diagnosis not present

## 2020-02-20 LAB — RESP PANEL BY RT-PCR (FLU A&B, COVID) ARPGX2
Influenza A by PCR: NEGATIVE
Influenza B by PCR: NEGATIVE
SARS Coronavirus 2 by RT PCR: NEGATIVE

## 2020-02-20 MED ORDER — IPRATROPIUM BROMIDE 0.06 % NA SOLN: 2.0000 | Freq: Four times a day (QID) | NASAL | 12 refills | Status: DC

## 2020-02-20 MED ORDER — PSEUDOEPH-BROMPHEN-DM 30-2-10 MG/5ML PO SYRP: 10.0000 mL | ORAL_SOLUTION | Freq: Four times a day (QID) | ORAL | 0 refills | Status: AC | PRN

## 2020-02-20 NOTE — ED Provider Notes (Signed)
Patient states [facility]    CSN: [id number] Arrival date & time: 02/20/20  1710      History   Chief Complaint Chief Complaint  Patient presents with  . Cough  . Nasal Congestion  . Sore Throat    HPI Susan Garner is a 49 y.o. female presenting for onset of scratchy throat, mild cough and nasal congestion as well as fatigue today.  Her daughter had COVID-19 a couple weeks ago.  Patient has a personal history of COVID-19 in January 2021.  She is fully vaccinated for COVID-19.  Has not taken any over-the-counter medication for symptoms yet. Patient denies fever, weakness, chest discomfort, difficulty breathing, abdominal pain, N/V/D, or changes in smell or taste. No history of cardiopulmonary disease. No other complaints or concerns today.  HPI  Past Medical History:  Diagnosis Date  . Allergy   . Anxiety   . Depression   . Headache   . Kidney stones   . Ovarian cyst     Patient Active Problem List   Diagnosis Date Noted  . Major depressive disorder, recurrent episode with anxious distress (HCC) 02/20/2019  . Insomnia due to medical condition 02/20/2019  . High risk medication use 02/20/2019  . Noncompliance with treatment plan 02/20/2019  . Kidney stone on right side 07/22/2015  . Right flank pain 07/22/2015  . Cyst of right ovary 07/22/2015  . Anxiety 11/25/2014  . Depression 11/25/2014  . Fatigue 11/25/2014  . Right ear pain 11/25/2014  . 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 Displacement of lumbar intervertebral disc without myelopathy 07/23/2013  . Bulge of lumbar disc without myelopathy 07/23/2013    Past Surgical History:  Procedure Laterality Date  . CYSTOSCOPY/URETEROSCOPY/HOLMIUM LASER/STENT PLACEMENT Right 01/07/2020   Procedure: CYSTOSCOPY/URETEROSCOPY/HOLMIUM LASER/STENT PLACEMENT;  Surgeon: Riki Altes, MD;  Location: [facility] ORS;  Service: Urology;  Laterality: Right;  .  FEMUR FRACTURE SURGERY Right 1980's  . KIDNEY STONE SURGERY    . TONSILLECTOMY    . TUBAL LIGATION      OB History    Gravida  1   Para  1   Term  1   Preterm      AB      Living  1     SAB      IAB      Ectopic      Multiple      Live Births  1            Home Medications    Prior to Admission medications   Medication Sig Start Date End Date Taking? Authorizing Provider  brompheniramine-pseudoephedrine-DM 30-2-10 MG/5ML syrup Take 10 mLs by mouth 4 (four) times daily as needed for up to 7 days. 02/20/20 02/27/20 Yes Eusebio Friendly B, PA-C  ipratropium (ATROVENT) 0.06 % nasal spray Place 2 sprays into both nostrils 4 (four) times daily. 02/20/20  Yes Eusebio Friendly B, PA-C  cephALEXin (KEFLEX) 500 MG capsule Take 500 mg by mouth 3 (three) times daily. 01/07/20   [provider]  HYDROcodone-acetaminophen (NORCO) 5-325 MG tablet Take 1 tablet by mouth every 4 (four) hours as needed for moderate pain. 01/04/20   Willy Eddy, MD  ondansetron (ZOFRAN) 4 MG tablet Take 1 tablet (4 mg total) by mouth daily as needed. 01/04/20 01/03/21  Willy Eddy, MD  oseltamivir (TAMIFLU) 75 MG capsule Take 1 capsule (75 mg total) by mouth daily. 02/07/20   [name], PA-C  oxybutynin (DITROPAN) 5 MG tablet 1 tab tid prn frequency,urgency, bladder spasm 01/07/20   Stoioff, Verna Czech, MD  sulfamethoxazole-trimethoprim (BACTRIM DS) 800-160 MG tablet Take 1 tablet by mouth 2 (two) times daily. 01/10/20   Vanna Scotland, MD  tamsulosin (FLOMAX) 0.4 MG CAPS capsule Take 1 capsule (0.4 mg total) by mouth daily after supper. 01/04/20   Willy Eddy, MD  venlafaxine XR (EFFEXOR-XR) 150 MG 24 hr capsule TAKE 1 CAPSULE (150 MG TOTAL) BY MOUTH DAILY WITH BREAKFAST. 09/25/19   [name], PA-C    Family History Family History  Problem Relation Age of Onset  . Anxiety disorder Mother   . Cancer Neg Hx   . Diabetes Neg Hx   . Heart disease Neg Hx   .  Breast cancer Neg Hx     Social History Social History   Tobacco Use  . Smoking status: Never Smoker  . Smokeless tobacco: Never Used  Substance Use Topics  . Alcohol use: No    Alcohol/week: 0.0 standard drinks  . Drug use: No     Allergies   Patient has no known allergies.   Review of Systems Review of Systems  Constitutional: Positive for fatigue. Negative for chills, diaphoresis and fever.  HENT: Positive for congestion and rhinorrhea. Negative for ear pain, sinus pressure, sinus pain and sore throat.   Respiratory: Positive for cough. Negative for shortness of breath.   Gastrointestinal: Negative for abdominal pain, nausea and vomiting.  Musculoskeletal: Negative for arthralgias and myalgias.  Skin: Negative for rash.  Neurological: Negative for weakness and headaches.  Hematological: Negative for adenopathy.     Physical Exam Triage Vital Signs ED Triage Vitals [02/20/20 1847]  Enc Vitals Group     BP      Pulse      Resp      Temp      Temp src      SpO2      Weight      Height      Head Circumference      Peak Flow      Pain Score 0     Pain Loc      Pain Edu?      Excl. in GC?    No data found.  Updated Vital Signs BP 133/66 (BP Location: Right Arm)   Pulse 88   Temp 98.2 F (36.8 C) (Oral)   Resp 16   LMP  (Within Months) Comment: a month ago   SpO2 100%       Physical Exam Vitals and nursing note reviewed.  Constitutional:      General: She is not in acute distress.    Appearance: Normal appearance. She is not ill-appearing or toxic-appearing.  HENT:     Head: Normocephalic and atraumatic.     Nose: No congestion or rhinorrhea.     Mouth/Throat:     Mouth: Mucous membranes are moist.     Pharynx: Oropharynx is clear.  Eyes:     General: No scleral icterus.       Right eye: No discharge.        Left eye: No discharge.     Conjunctiva/sclera: Conjunctivae normal.  Cardiovascular:     Rate and Rhythm: Normal rate and regular  rhythm.     Heart sounds: Normal heart sounds.  Pulmonary:     Effort: Pulmonary effort is normal. No respiratory distress.     Breath sounds: Normal breath sounds.  Musculoskeletal:  Cervical back: Neck supple.  Skin:    General: Skin is dry.  Neurological:     General: No focal deficit present.     Mental Status: She is alert. Mental status is at baseline.     Motor: No weakness.     Gait: Gait normal.  Psychiatric:        Mood and Affect: Mood normal.        Behavior: Behavior normal.        Thought Content: Thought content normal.      UC Treatments / Results  Labs (all labs ordered are listed, but only abnormal results are displayed) Labs Reviewed  RESP PANEL BY RT-PCR (FLU A&B, COVID) [id number]    EKG   Radiology No results found.  Procedures Procedures (including critical care time)  Medications Ordered in UC Medications - No data to display  Initial Impression / Assessment and Plan / UC Course  I have reviewed the triage vital signs and the nursing notes.  Pertinent labs & imaging results that were available during my care of the patient were reviewed by me and considered in my medical decision making (see chart for details).   All vital signs normal and stable. Patient in no acute distress. Exam benign. Respiratory panel obtained. Negative for COVID-19. Advised supportive care with increasing rest and fluids. Sent Bromfed and Atrovent nasal spray. Advised follow-up with [place] as needed for new or worsening symptoms. ED precautions reviewed.   Final Clinical Impressions(s) / UC Diagnoses   Final diagnoses:  Viral illness  Cough  Nasal congestion  Exposure to COVID-19 virus     Discharge Instructions     URI/COLD SYMPTOMS: Your exam today is consistent with a viral illness. Antibiotics are not indicated at this time. Use medications as directed, including cough syrup, nasal saline, and decongestants. Your symptoms should improve over the next few days  and resolve within 7-10 days. Increase rest and fluids. F/u if symptoms worsen or predominate such as sore throat, ear pain, productive cough, shortness of breath, or if you develop high fevers or worsening fatigue over the next several days.      ED Prescriptions    Medication Sig Dispense Auth. Provider   brompheniramine-pseudoephedrine-DM 30-2-10 MG/5ML syrup Take 10 mLs by mouth 4 (four) times daily as needed for up to 7 days. 150 mL Eusebio Friendly B, PA-C   ipratropium (ATROVENT) 0.06 % nasal spray Place 2 sprays into both nostrils 4 (four) times daily. 15 mL Shirlee Latch, PA-C     PDMP not reviewed this encounter.   Shirlee Latch, PA-C 02/22/20 1243

## 2020-02-20 NOTE — ED Triage Notes (Signed)
Patient presents to Urgent Care with complaints of cough, congestion, sore throat Since yesterday. Patient reports daughter tested positive for COVID. Treating symptoms with tylenol sinus med.

## 2020-02-20 NOTE — Discharge Instructions (Signed)
URI/COLD SYMPTOMS: Your exam today is consistent with a viral illness. Antibiotics are not indicated at this time. Use medications as directed, including cough syrup, nasal saline, and decongestants. Your symptoms should improve over the next few days and resolve within 7-10 days. Increase rest and fluids. F/u if symptoms worsen or predominate such as sore throat, ear pain, productive cough, shortness of breath, or if you develop high fevers or worsening fatigue over the next several days.    

## 2020-02-25 DIAGNOSIS — Z01411 Encounter for gynecological examination (general) (routine) with abnormal findings: Secondary | ICD-10-CM | POA: Diagnosis not present

## 2020-03-20 ENCOUNTER — Ambulatory Visit: Payer: Self-pay | Admitting: Urology

## 2020-03-29 DIAGNOSIS — N200 Calculus of kidney: Secondary | ICD-10-CM | POA: Diagnosis not present

## 2020-03-30 ENCOUNTER — Other Ambulatory Visit: Payer: BC Managed Care – PPO

## 2020-03-30 ENCOUNTER — Other Ambulatory Visit: Payer: Self-pay

## 2020-03-30 DIAGNOSIS — N200 Calculus of kidney: Secondary | ICD-10-CM | POA: Diagnosis not present

## 2020-04-09 ENCOUNTER — Encounter: Payer: Self-pay | Admitting: *Deleted

## 2020-04-10 ENCOUNTER — Ambulatory Visit: Payer: Self-pay | Admitting: Urology

## 2020-04-10 NOTE — Telephone Encounter (Signed)
Called [facility] [phone]) and spoke to Britt.  Results are complete.  I provided the fax number to the office for the faxed report.  Patient's appt rescheduled.

## 2020-04-13 ENCOUNTER — Other Ambulatory Visit: Payer: Self-pay | Admitting: Urology

## 2020-04-15 ENCOUNTER — Ambulatory Visit (INDEPENDENT_AMBULATORY_CARE_PROVIDER_SITE_OTHER): Payer: BC Managed Care – PPO | Admitting: Urology

## 2020-04-15 ENCOUNTER — Other Ambulatory Visit: Payer: Self-pay

## 2020-04-15 ENCOUNTER — Encounter: Payer: Self-pay | Admitting: Urology

## 2020-04-15 VITALS — BP 119/71 | HR 76 | Ht 70.0 in | Wt 170.0 lb

## 2020-04-15 DIAGNOSIS — Z87442 Personal history of urinary calculi: Secondary | ICD-10-CM

## 2020-04-15 DIAGNOSIS — R82991 Hypocitraturia: Secondary | ICD-10-CM | POA: Diagnosis not present

## 2020-04-15 MED ORDER — POTASSIUM CITRATE ER 10 MEQ (1080 MG) PO TBCR: 20.0000 meq | EXTENDED_RELEASE_TABLET | Freq: Two times a day (BID) | ORAL | 6 refills | Status: DC

## 2020-04-15 NOTE — Progress Notes (Signed)
04/15/2020 1:09 PM   Susan Garner 1970-07-09 [id number]  Referring provider: [name], PA-C [address] [place],  [place] [address]  Chief Complaint  Patient presents with  . Follow-up    Urologic history: 1.  Recurrent stone disease -Passed calculus 2017 -Ureteroscopic removal 8 mm right proximal calculus 12/2019 -No renal calculi on CT 12/2019   HPI: 50 y.o. female presents for follow-up.   No problems after stent removed 01/14/2020  Stone analysis CaOxMono/uric acid 70/30  Presents today for review of her metabolic evaluation  Extremely low urine volume at 350 mL; supersaturation calcium oxalate elevated 8.0; hypocitraturia at 251 mg/day; urine pH low at 5.5 and supersaturation uric acid elevated 2.37  Also noted to have low urine magnesium  Serum studies were normal  PMH: Past Medical History:  Diagnosis Date  . Allergy   . Anxiety   . Depression   . Headache   . Kidney stones   . Ovarian cyst     Surgical History: Past Surgical History:  Procedure Laterality Date  . CYSTOSCOPY/URETEROSCOPY/HOLMIUM LASER/STENT PLACEMENT Right 01/07/2020   Procedure: CYSTOSCOPY/URETEROSCOPY/HOLMIUM LASER/STENT PLACEMENT;  Surgeon: Riki Altes, MD;  Location: [facility] ORS;  Service: Urology;  Laterality: Right;  . FEMUR FRACTURE SURGERY Right 1980's  . KIDNEY STONE SURGERY    . TONSILLECTOMY    . TUBAL LIGATION      Home Medications:  Allergies as of 04/15/2020   No Known Allergies     Medication List       Accurate as of April 15, 2020  1:09 PM. If you have any questions, ask your nurse or doctor.        STOP taking these medications   cephALEXin 500 MG capsule Commonly known as: KEFLEX Stopped by: Riki Altes, MD   HYDROcodone-acetaminophen 5-325 MG tablet Commonly known as: Norco Stopped by: Riki Altes, MD   ipratropium 0.06 % nasal spray Commonly known as: ATROVENT Stopped by: Riki Altes, MD    ondansetron 4 MG tablet Commonly known as: Zofran Stopped by: Riki Altes, MD   oseltamivir 75 MG capsule Commonly known as: Tamiflu Stopped by: Riki Altes, MD   oxybutynin 5 MG tablet Commonly known as: DITROPAN Stopped by: Riki Altes, MD   sulfamethoxazole-trimethoprim 800-160 MG tablet Commonly known as: BACTRIM DS Stopped by: Riki Altes, MD   tamsulosin 0.4 MG Caps capsule Commonly known as: FLOMAX Stopped by: Riki Altes, MD     TAKE these medications   venlafaxine XR 150 MG 24 hr capsule Commonly known as: EFFEXOR-XR TAKE 1 CAPSULE (150 MG TOTAL) BY MOUTH DAILY WITH BREAKFAST.       Allergies: No Known Allergies  Family History: Family History  Problem Relation Age of Onset  . Anxiety disorder Mother   . Cancer Neg Hx   . Diabetes Neg Hx   . Heart disease Neg Hx   . Breast cancer Neg Hx     Social History:  reports that she has never smoked. She has never used smokeless tobacco. She reports that she does not drink alcohol and does not use drugs.   Physical Exam: BP 119/71   Pulse 76   Ht 5\' 10"  (1.778 m)   Wt 170 lb (77.1 kg)   BMI 24.39 kg/m   Constitutional:  Alert and oriented, No acute distress. HEENT: [place] AT, moist mucus membranes.  Trachea midline, no masses. Cardiovascular: No clubbing, cyanosis, or edema. Respiratory: Normal respiratory  effort, no increased work of breathing. Skin: No rashes, bruises or suspicious lesions. Neurologic: Grossly intact, no focal deficits, moving all 4 extremities. Psychiatric: Normal mood and affect.    Assessment & Plan:    1.  Recurrent nephrolithiasis  Significant abnormalities identified including extremely low urine volume, marked hypocitraturia, low urine pH, low urine magnesium  It was stressed cornerstone of stone prevention is increased water intake and it was recommended she drink enough water to keep urine output between 2-2.5 L/day  And the use of citrus rich beverages  was discussed  Start potassium citrate 20 meQ twice daily  Magnesium supplement recommended  Follow-up 6 months with KUB   Riki Altes, MD  [facility] [address] [place], [place] [address] [phone]

## 2020-05-26 DIAGNOSIS — R87610 Atypical squamous cells of undetermined significance on cytologic smear of cervix (ASC-US): Secondary | ICD-10-CM | POA: Diagnosis not present

## 2020-08-18 ENCOUNTER — Ambulatory Visit
Admission: RE | Admit: 2020-08-18 | Discharge: 2020-08-18 | Disposition: A | Discharge status: Home / Self Care | Payer: BC Managed Care – PPO | Source: Ambulatory Visit | Attending: Obstetrics and Gynecology | Admitting: Obstetrics and Gynecology

## 2020-08-18 ENCOUNTER — Other Ambulatory Visit: Payer: Self-pay

## 2020-08-18 DIAGNOSIS — Z1231 Encounter for screening mammogram for malignant neoplasm of breast: Secondary | ICD-10-CM | POA: Diagnosis not present

## 2020-08-31 ENCOUNTER — Ambulatory Visit
Admission: RE | Admit: 2020-08-31 | Discharge: 2020-08-31 | Disposition: A | Discharge status: Home / Self Care | Payer: Managed Care, Other (non HMO) | Source: Ambulatory Visit | Attending: Physician Assistant | Admitting: Physician Assistant

## 2020-08-31 ENCOUNTER — Encounter: Payer: Self-pay | Admitting: Physician Assistant

## 2020-08-31 ENCOUNTER — Other Ambulatory Visit: Payer: Self-pay

## 2020-08-31 ENCOUNTER — Ambulatory Visit (INDEPENDENT_AMBULATORY_CARE_PROVIDER_SITE_OTHER): Payer: Self-pay | Admitting: Physician Assistant

## 2020-08-31 ENCOUNTER — Ambulatory Visit
Admission: RE | Admit: 2020-08-31 | Discharge: 2020-08-31 | Disposition: A | Discharge status: Home / Self Care | Payer: Managed Care, Other (non HMO) | Attending: Physician Assistant | Admitting: Physician Assistant

## 2020-08-31 VITALS — BP 140/78 | HR 80 | Temp 97.9°F | Ht 70.0 in | Wt 170.0 lb

## 2020-08-31 DIAGNOSIS — Z87442 Personal history of urinary calculi: Secondary | ICD-10-CM

## 2020-08-31 DIAGNOSIS — N201 Calculus of ureter: Secondary | ICD-10-CM

## 2020-08-31 LAB — MICROSCOPIC EXAMINATION
Epithelial Cells (non renal): >10 /hpf — AB (ref 0–10)
RBC, Urine: >30 /hpf — AB (ref 0–2)

## 2020-08-31 LAB — URINALYSIS, COMPLETE
Bilirubin, UA: NEGATIVE
Glucose, UA: NEGATIVE
Ketones, UA: NEGATIVE
Nitrite, UA: NEGATIVE
Protein,UA: NEGATIVE
Specific Gravity, UA: 1.01 (ref 1.005–1.030)
Urobilinogen, Ur: 0.2 mg/dL (ref 0.2–1.0)
pH, UA: 5.5 (ref 5.0–7.5)

## 2020-08-31 MED ORDER — ONDANSETRON HCL 4 MG PO TABS: 4.0000 mg | ORAL_TABLET | Freq: Three times a day (TID) | ORAL | 0 refills | Status: AC | PRN

## 2020-08-31 MED ORDER — TAMSULOSIN HCL 0.4 MG PO CAPS: 0.4000 mg | ORAL_CAPSULE | Freq: Every day | ORAL | 0 refills | Status: DC

## 2020-08-31 MED ORDER — KETOROLAC TROMETHAMINE 60 MG/2ML IM SOLN
15.0000 mg | Freq: Once | INTRAMUSCULAR | Status: AC
  Administered 2020-08-31: 15 mg via INTRAMUSCULAR

## 2020-08-31 MED ORDER — OXYCODONE-ACETAMINOPHEN 5-325 MG PO TABS: 1.0000 | ORAL_TABLET | Freq: Four times a day (QID) | ORAL | 0 refills | Status: AC | PRN

## 2020-08-31 NOTE — Patient Instructions (Addendum)
For the next 4 weeks, please do the following: -Take Flomax 0.4mg  daily as prescribed today -Stay well hydrated -Strain your urine to catch any stones that pass -Treat any pain with ibuprofen/tylenol or Percocet as prescribed today -Treat any nausea with Zofran as prescribed today  I will plan to see you back in clinic in 4 weeks with another x-ray prior to see if you have passed your stone.  Please call our office immediately (we are open 8a-5p Monday-Friday) or go to the Emergency Department if you develop any of the following: -Fever -Chills -Nausea and/or vomiting uncontrollable with Zofran -Pain uncontrollable with Percocet

## 2020-08-31 NOTE — Progress Notes (Signed)
08/31/2020 11:03 AM   Susan Garner 1970/10/25 [id number]  CC: Chief Complaint  Patient presents with   Pelvic Pain   Back Pain    HPI: Susan Garner is a 50 y.o. female with PMH recurrent nephrolithiasis who underwent URS in November 2021 who presents today for evaluation of possible acute stone episode.   Today she reports a 3-day history of severe, 10/10 centralized pelvic and low back pain accompanied by diarrhea and occasional nausea.  She denies fever, chills, and vomiting. She is concerned about her renal function and states she has been increasing her water intake per Dr. Heywood Footman previous recommendations.  KUB today with stable left hemipelvic calcification, likely phlebolith, as well as a new 4 mm left hemipelvic calcification consistent with distal left ureteral stone.  In-office UA today positive for 3+ blood and 2+ leukocyte esterase; urine microscopy with 6-10 WBCs/HPF, >30 RBCs/HPF, and >10 epithelial cells/hpf.  PMH: Past Medical History:  Diagnosis Date   Allergy    Anxiety    Depression    Headache    Kidney stones    Ovarian cyst     Surgical History: Past Surgical History:  Procedure Laterality Date   CYSTOSCOPY/URETEROSCOPY/HOLMIUM LASER/STENT PLACEMENT Right 01/07/2020   Procedure: CYSTOSCOPY/URETEROSCOPY/HOLMIUM LASER/STENT PLACEMENT;  Surgeon: Riki Altes, MD;  Location: [facility] ORS;  Service: Urology;  Laterality: Right;   FEMUR FRACTURE SURGERY Right 1980's   KIDNEY STONE SURGERY     TONSILLECTOMY     TUBAL LIGATION      Home Medications:  Allergies as of 08/31/2020   No Known Allergies      Medication List        Accurate as of August 31, 2020 11:03 AM. If you have any questions, ask your nurse or doctor.          potassium citrate 10 MEQ (1080 MG) SR tablet Commonly known as: UROCIT-K Take 2 tablets (20 mEq total) by mouth 2 (two) times daily with a meal.   valACYclovir 500 MG tablet Commonly known as:  VALTREX Take by mouth.   venlafaxine XR 150 MG 24 hr capsule Commonly known as: EFFEXOR-XR TAKE 1 CAPSULE (150 MG TOTAL) BY MOUTH DAILY WITH BREAKFAST.        Allergies:  No Known Allergies  Family History: Family History  Problem Relation Age of Onset   Anxiety disorder Mother    Cancer Neg Hx    Diabetes Neg Hx    Heart disease Neg Hx    Breast cancer Neg Hx     Social History:   reports that she has never smoked. She has never used smokeless tobacco. She reports that she does not drink alcohol and does not use drugs.  Physical Exam: BP 140/78 (BP Location: Left Arm, Patient Position: Lying right side, Cuff Size: Normal)   Pulse 80   Temp 97.9 F (36.6 C) (Oral)   Ht 5\' 10"  (1.778 m)   Wt 170 lb (77.1 kg)   BMI 24.39 kg/m   Constitutional:  Alert and oriented, uncomfortable appearing, nontoxic appearing HEENT: [name], AT Cardiovascular: No clubbing, cyanosis, or edema Respiratory: Normal respiratory effort, no increased work of breathing Skin: No rashes, bruises or suspicious lesions Neurologic: Grossly intact, no focal deficits, moving all 4 extremities Psychiatric: Normal mood and affect  Laboratory Data: Results for orders placed or performed in visit on 08/31/20  Microscopic Examination   Urine  Result Value Ref Range   WBC, UA 6-10 (A) 0 - 5 /hpf  RBC >30 (A) 0 - 2 /hpf   Epithelial Cells (non renal) >10 (A) 0 - 10 /hpf   Bacteria, UA Few None seen/Few  Urinalysis, Complete  Result Value Ref Range   Specific Gravity, UA 1.010 1.005 - 1.030   pH, UA 5.5 5.0 - 7.5   Color, UA Yellow Yellow   Appearance Ur Hazy (A) Clear   Leukocytes,UA 2+ Negative   Protein,UA Negative Negative/Trace   Glucose, UA Negative Negative   Ketones, UA Negative Negative   RBC, UA 3+ Negative   Bilirubin, UA Negative Negative   Urobilinogen, Ur 0.2 0.2 - 1.0 mg/dL   Nitrite, UA Negative Negative   Microscopic Examination See below:    Pertinent Imaging: KUB,  08/31/2020: CLINICAL DATA:  History of kidney stones with right flank pain, initial encounter   EXAM: ABDOMEN - 1 VIEW   COMPARISON:  01/04/2020   FINDINGS: Scattered large and small bowel gas is noted. No renal calculi are seen. No ureteral stones are noted. No bony abnormality is noted.   IMPRESSION: No definitive renal or ureteral stones are seen.     Electronically Signed   By: Alcide Clever M.D.   On: 09/01/2020 02:42  I personally reviewed the images referenced above and note to left hemipelvic calcifications, 1 consistent with prior phlebolith and 1 indicating a likely 4 mm distal left ureteral stone.  Assessment & Plan:   1. Left ureteral stone UA and KUB consistent with acute stone episode associated with a likely 4 mm distal left ureteral stone.  We administered 15 mg of IM Toradol in clinic today, which relieved her pain.  I explained to the patient that given stone size and position, she has an approximate 90% chance of spontaneous passage of the stone and I encouraged her to proceed with a trial of passage given appropriate pain control with analgesia.  She is in agreement with this plan.  I am prescribing daily Flomax, as needed Zofran, and as needed Percocet for symptom management.  Counseled her to strain her urine and will plan for repeat UA and KUB in 4 weeks.  Per patient request, I provided her with 2 copies of a letter verifying her presents in clinic today for her employers.  We discussed that she should not operate heavy machinery on narcotic pain medications.  Additionally, we reviewed return precautions today including fever, chills, uncontrollable nausea/vomiting, and uncontrollable pain.  She expressed understanding.  May consider repeat metabolic workup on potassium citrate once she has passed the stone. - Urinalysis, Complete - ketorolac (TORADOL) injection 15 mg - oxyCODONE-acetaminophen (PERCOCET/ROXICET) 5-325 MG tablet; Take 1-2 tablets by mouth  every 6 (six) hours as needed for up to 5 days for severe pain.  Dispense: 12 tablet; Refill: 0 - tamsulosin (FLOMAX) 0.4 MG CAPS capsule; Take 1 capsule (0.4 mg total) by mouth daily.  Dispense: 30 capsule; Refill: 0 - ondansetron (ZOFRAN) 4 MG tablet; Take 1 tablet (4 mg total) by mouth every 8 (eight) hours as needed for up to 14 days.  Dispense: 20 tablet; Refill: 0    Return in about 4 weeks (around 09/28/2020) for Stone f/u with UA + KUB prior.  Carman Ching, PA-C  [facility] [address] [place], [place] [address] [phone]

## 2020-09-01 ENCOUNTER — Telehealth: Payer: Self-pay | Admitting: Urology

## 2020-09-01 ENCOUNTER — Other Ambulatory Visit: Payer: Managed Care, Other (non HMO)

## 2020-09-01 DIAGNOSIS — N201 Calculus of ureter: Secondary | ICD-10-CM

## 2020-09-01 NOTE — Telephone Encounter (Signed)
Patient notified, appt confirmed and instructions given for xray day of appt. Patient verbalized understanding.

## 2020-09-01 NOTE — Telephone Encounter (Signed)
Patient passed her stone and brought it in I will give it to the CMA  Thanks, Marcelino Duster

## 2020-09-04 LAB — CALCULI, WITH PHOTOGRAPH (CLINICAL LAB)
Calcium Oxalate Dihydrate: 20 %
Calcium Oxalate Monohydrate: 80 %
Weight Calculi: 36 mg

## 2020-09-14 ENCOUNTER — Other Ambulatory Visit: Payer: Self-pay | Admitting: Physician Assistant

## 2020-09-14 DIAGNOSIS — N201 Calculus of ureter: Secondary | ICD-10-CM

## 2020-09-25 ENCOUNTER — Other Ambulatory Visit: Payer: Self-pay | Admitting: Physician Assistant

## 2020-09-25 DIAGNOSIS — F321 Major depressive disorder, single episode, moderate: Secondary | ICD-10-CM

## 2020-09-25 DIAGNOSIS — F411 Generalized anxiety disorder: Secondary | ICD-10-CM

## 2020-09-25 NOTE — Telephone Encounter (Signed)
Copied from CRM [id number]. Topic: Quick Communication - Rx Refill/Question >> Sep 25, 2020  1:24 PM Izora Ribas, Everette A wrote: Medication: venlafaxine XR (EFFEXOR-XR) 150 MG 24 hr capsule   Has the patient contacted their pharmacy? Yes.   (Agent: If no, request that the patient contact the pharmacy for the refill.) (Agent: If yes, when and what did the pharmacy advise?)  Preferred Pharmacy (with phone number or street name): [id number] [place],  - [address]  Phone:  [phone] Fax:  [phone]   Agent: Please be advised that RX refills may take up to 3 business days. We ask that you follow-up with your pharmacy.

## 2020-09-28 MED ORDER — VENLAFAXINE HCL ER 150 MG PO CP24: 150.0000 mg | ORAL_CAPSULE | Freq: Every day | ORAL | 0 refills | Status: DC

## 2020-10-14 ENCOUNTER — Ambulatory Visit: Payer: Self-pay | Admitting: Urology

## 2020-10-23 ENCOUNTER — Other Ambulatory Visit: Payer: Self-pay

## 2020-10-23 ENCOUNTER — Encounter: Payer: Self-pay | Admitting: Urology

## 2020-10-23 ENCOUNTER — Ambulatory Visit
Admission: RE | Admit: 2020-10-23 | Discharge: 2020-10-23 | Disposition: A | Discharge status: Home / Self Care | Payer: Managed Care, Other (non HMO) | Source: Ambulatory Visit | Attending: Urology | Admitting: Urology

## 2020-10-23 ENCOUNTER — Ambulatory Visit (INDEPENDENT_AMBULATORY_CARE_PROVIDER_SITE_OTHER): Payer: Managed Care, Other (non HMO) | Admitting: Urology

## 2020-10-23 ENCOUNTER — Ambulatory Visit
Admission: RE | Admit: 2020-10-23 | Discharge: 2020-10-23 | Disposition: A | Discharge status: Home / Self Care | Payer: Managed Care, Other (non HMO) | Attending: Urology | Admitting: Urology

## 2020-10-23 VITALS — BP 120/77 | HR 79 | Ht 68.0 in | Wt 170.0 lb

## 2020-10-23 DIAGNOSIS — Z87442 Personal history of urinary calculi: Secondary | ICD-10-CM

## 2020-10-23 DIAGNOSIS — Z0389 Encounter for observation for other suspected diseases and conditions ruled out: Secondary | ICD-10-CM | POA: Insufficient documentation

## 2020-10-23 DIAGNOSIS — N201 Calculus of ureter: Secondary | ICD-10-CM

## 2020-10-23 NOTE — Progress Notes (Signed)
10/23/2020 12:10 PM   Susan Garner Delila Spence 06-Jul-1970 [id number]  Referring provider: [name], PA-C [address] [place],  [place] [address]  Chief Complaint  Patient presents with   Nephrolithiasis    Urologic history: 1.  Recurrent stone disease -Passed calculus 2017 -Ureteroscopic removal 8 mm right proximal calculus 12/2019 -Metabolic evaluation remarkable for low urine volume at 350 mL and hypocitraturia -On potassium citrate  HPI: 50 y.o. female presents for follow-up.  She saw Hilton Sinclair in early July with complaints of left flank pain KUB consistent with a left UVJ calculus which she subsequently passed Stone analysis CaOxMono/CaOxDi 80/20 Presently asymptomatic On review of previous CT November 2021 this stone was present in the lower pole of the left kidney KUB today reviewed and no calcifications suspicious for urinary tract stones are identified   PMH: Past Medical History:  Diagnosis Date   Allergy    Anxiety    Depression    Headache    Kidney stones    Ovarian cyst     Surgical History: Past Surgical History:  Procedure Laterality Date   CYSTOSCOPY/URETEROSCOPY/HOLMIUM LASER/STENT PLACEMENT Right 01/07/2020   Procedure: CYSTOSCOPY/URETEROSCOPY/HOLMIUM LASER/STENT PLACEMENT;  Surgeon: Riki Altes, MD;  Location: [facility];  Service: Urology;  Laterality: Right;   FEMUR FRACTURE SURGERY Right 1980's   KIDNEY STONE SURGERY     TONSILLECTOMY     TUBAL LIGATION      Home Medications:  Allergies as of 10/23/2020   No Known Allergies      Medication List        Accurate as of October 23, 2020 12:10 PM. If you have any questions, ask your nurse or doctor.          potassium citrate 10 MEQ (1080 MG) SR tablet Commonly known as: UROCIT-K Take 2 tablets (20 mEq total) by mouth 2 (two) times daily with a meal.   tamsulosin 0.4 MG Caps capsule Commonly known as: FLOMAX Take 1 capsule (0.4 mg total) by  mouth daily.   valACYclovir 500 MG tablet Commonly known as: VALTREX Take by mouth.   venlafaxine XR 150 MG 24 hr capsule Commonly known as: EFFEXOR-XR Take 1 capsule (150 mg total) by mouth daily with breakfast. Please tell patient she will need to be seen for more        Allergies: No Known Allergies  Family History: Family History  Problem Relation Age of Onset   Anxiety disorder Mother    Cancer Neg Hx    Diabetes Neg Hx    Heart disease Neg Hx    Breast cancer Neg Hx     Social History:  reports that she has never smoked. She has never used smokeless tobacco. She reports that she does not drink alcohol and does not use drugs.   Physical Exam: There were no vitals taken for this visit.  Constitutional:  Alert and oriented, No acute distress. HEENT: [place] AT, moist mucus membranes.  Trachea midline, no masses. Cardiovascular: No clubbing, cyanosis, or edema. Respiratory: Normal respiratory effort, no increased work of breathing.   Laboratory Data: Lab Results  Component Value Date   WBC 5.9 01/04/2020   HGB 12.7 01/04/2020   HCT 39.2 01/04/2020   MCV 93.8 01/04/2020   PLT 310 01/04/2020    Lab Results  Component Value Date   CREATININE 0.87 01/04/2020    No results found for: PSA  No results found for: TESTOSTERONE  Lab Results  Component Value Date  HGBA1C 5.3 11/25/2014    Urinalysis    Component Value Date/Time   COLORURINE AMBER (A) 01/04/2020 0708   APPEARANCEUR Hazy (A) 08/31/2020 1054   LABSPEC 1.021 01/04/2020 0708   PHURINE 5.0 01/04/2020 0708   GLUCOSEU Negative 08/31/2020 1054   HGBUR LARGE (A) 01/04/2020 0708   BILIRUBINUR Negative 08/31/2020 1054   KETONESUR NEGATIVE 01/04/2020 0708   PROTEINUR Negative 08/31/2020 1054   PROTEINUR 100 (A) 01/04/2020 0708   UROBILINOGEN 0.2 07/22/2015 1516   NITRITE Negative 08/31/2020 1054   NITRITE NEGATIVE 01/04/2020 0708   LEUKOCYTESUR 2+ 08/31/2020 1054   LEUKOCYTESUR LARGE (A) 01/04/2020  0708    Lab Results  Component Value Date   LABMICR See below: 08/31/2020   WBCUA 6-10 (A) 08/31/2020   LABEPIT >10 (A) 08/31/2020   BACTERIA Few 08/31/2020    Pertinent Imaging: Images personally reviewed and interpreted  Abdomen 1 view (KUB)  Narrative CLINICAL DATA:  History of kidney stones with right flank pain, initial encounter  EXAM: ABDOMEN - 1 VIEW  COMPARISON:  01/04/2020  FINDINGS: Scattered large and small bowel gas is noted. No renal calculi are seen. No ureteral stones are noted. No bony abnormality is noted.  IMPRESSION: No definitive renal or ureteral stones are seen.   Electronically Signed By: Alcide Clever M.D. On: 09/01/2020 02:42    Assessment & Plan:    1.  Recurrent nephrolithiasis Recently passed left ureteral calculus which was present on prior CT We again discussed increasing water intake to keep urine output >2.5 L/day Continue potassium citrate 7-month follow-up with KUB   Riki Altes, MD  [facility] [address], [address] [place], [place] [address] [phone]

## 2020-10-25 ENCOUNTER — Encounter: Payer: Self-pay | Admitting: Urology

## 2020-10-26 LAB — CULTURE, URINE COMPREHENSIVE

## 2020-10-27 ENCOUNTER — Telehealth: Payer: Self-pay | Admitting: *Deleted

## 2020-10-27 LAB — URINALYSIS, COMPLETE
Bilirubin, UA: NEGATIVE
Glucose, UA: NEGATIVE
Ketones, UA: NEGATIVE
Nitrite, UA: NEGATIVE
Protein,UA: NEGATIVE
Specific Gravity, UA: 1.02 (ref 1.005–1.030)
Urobilinogen, Ur: 1 mg/dL (ref 0.2–1.0)
pH, UA: 5.5 (ref 5.0–7.5)

## 2020-10-27 LAB — MICROSCOPIC EXAMINATION

## 2020-10-27 NOTE — Telephone Encounter (Signed)
-----   Message from Riki Altes, MD sent at 10/27/2020  9:53 AM EDT ----- Urine culture grew a low level of bacteria most likely representing colonization.  Would not recommend treatment, she is having UTI symptoms

## 2020-10-27 NOTE — Telephone Encounter (Signed)
Notified patient as instructed, patient pleased. Patient states no UTI symptoms

## 2021-02-23 ENCOUNTER — Telehealth: Payer: Self-pay | Admitting: Physician Assistant

## 2021-02-23 NOTE — Telephone Encounter (Signed)
[facility] request for the following medications:  venlafaxine XR (EFFEXOR-XR) 150 MG 24 hr capsule   Please advise.  

## 2021-02-24 NOTE — Telephone Encounter (Signed)
Lmtcb to schedule. Pec please schedule with any available provider as soon as possible. Last ov 11/15/19.

## 2021-02-26 NOTE — Telephone Encounter (Signed)
Pt called in, scheduled ov for medication.

## 2021-03-18 ENCOUNTER — Encounter: Payer: Self-pay | Admitting: Family Medicine

## 2021-03-18 ENCOUNTER — Other Ambulatory Visit: Payer: Self-pay

## 2021-03-18 ENCOUNTER — Telehealth: Payer: Self-pay

## 2021-03-18 ENCOUNTER — Ambulatory Visit: Payer: Managed Care, Other (non HMO) | Admitting: Family Medicine

## 2021-03-18 VITALS — BP 124/69 | HR 68 | Resp 16 | Ht 68.0 in | Wt 178.4 lb

## 2021-03-18 DIAGNOSIS — R41840 Attention and concentration deficit: Secondary | ICD-10-CM | POA: Insufficient documentation

## 2021-03-18 DIAGNOSIS — F411 Generalized anxiety disorder: Secondary | ICD-10-CM | POA: Insufficient documentation

## 2021-03-18 DIAGNOSIS — E559 Vitamin D deficiency, unspecified: Secondary | ICD-10-CM | POA: Insufficient documentation

## 2021-03-18 DIAGNOSIS — F321 Major depressive disorder, single episode, moderate: Secondary | ICD-10-CM | POA: Diagnosis not present

## 2021-03-18 DIAGNOSIS — Z1211 Encounter for screening for malignant neoplasm of colon: Secondary | ICD-10-CM | POA: Insufficient documentation

## 2021-03-18 MED ORDER — VENLAFAXINE HCL ER 150 MG PO CP24: 150.0000 mg | ORAL_CAPSULE | Freq: Every day | ORAL | 3 refills | Status: DC

## 2021-03-18 MED ORDER — VENLAFAXINE HCL ER 75 MG PO CP24: 75.0000 mg | ORAL_CAPSULE | Freq: Every day | ORAL | 3 refills | Status: DC

## 2021-03-18 MED ORDER — VITAMIN D (ERGOCALCIFEROL) 1.25 MG (50000 UNIT) PO CAPS: 50000.0000 [IU] | ORAL_CAPSULE | ORAL | 3 refills | Status: DC

## 2021-03-18 NOTE — Assessment & Plan Note (Signed)
I've explained to her that drugs of the SSRI class can have side effects such as weight gain, sexual dysfunction, insomnia, headache, nausea. These medications are generally effective at alleviating symptoms of anxiety and/or depression. Let me know if significant side effects do occur. ° °Will Start additional 75 mg daily °Discussed potential side effects, incl GI upset, sexual dysfunction, increased anxiety, and SI °Discussed that it can take 6-8 weeks to reach full efficacy °Contracted for safety - no SI/HI °Discussed synergistic effects of medications and therapy ° °

## 2021-03-18 NOTE — Assessment & Plan Note (Signed)
Did not wish to restart on Wellbutrin to assist with concentration deficit Report of working 7 days/week Referral to neuro psych to assist with adult ADD/ADHD testing Pt made aware of delay in appts

## 2021-03-18 NOTE — Telephone Encounter (Signed)
CALLED PATIENT NO ANSWER LEFT VOICEMAIL FOR A CALL BACK ? ?

## 2021-03-18 NOTE — Assessment & Plan Note (Signed)
I've explained to her that drugs of the SSRI class can have side effects such as weight gain, sexual dysfunction, insomnia, headache, nausea. These medications are generally effective at alleviating symptoms of anxiety and/or depression. Let me know if significant side effects do occur.  Will Start additional 75 mg daily Discussed potential side effects, incl GI upset, sexual dysfunction, increased anxiety, and SI Discussed that it can take 6-8 weeks to reach full efficacy Contracted for safety - no SI/HI Discussed synergistic effects of medications and therapy

## 2021-03-18 NOTE — Progress Notes (Signed)
Established patient visit   Patient: Susan Garner   DOB: April 29, 1970   51 y.o. Female  MRN: [id number] Visit Date: 03/18/2021  Today's healthcare provider: Gwyneth Sprout, FNP   No chief complaint on file.  Subjective    HPI  Anxiety, Follow-up  She was last seen for anxiety 1 years ago. Changes made at last visit include :Improving with venlafaxine XR 150mg  daily and Buspar 5mg  TID. Advised she could decrease buspar as that may contribute to the fatigue.. She reports excellent compliance with treatment. She reports good tolerance of treatment. She is not having side effects.   She feels her anxiety is moderate to severe and Worse since last visit.  Symptoms: No chest pain Yes difficulty concentrating  No dizziness No fatigue  No feelings of losing control Yes insomnia  Yes irritable No palpitations  No panic attacks Yes racing thoughts  No shortness of breath No sweating  No tremors/shakes    GAD-7 Results GAD-7 Generalized Anxiety Disorder Screening Tool 05/30/2019  1. Feeling Nervous, Anxious, or on Edge 1  2. Not Being Able to Stop or Control Worrying 0  3. Worrying Too Much About Different Things 1  4. Trouble Relaxing 1  5. Being So Restless it's Hard To Sit Still 1  6. Becoming Easily Annoyed or Irritable 1  7. Feeling Afraid As If Something Awful Might Happen 0  Total GAD-7 Score 5  Difficulty At Work, Home, or Getting  Along With Others? Somewhat difficult    PHQ-9 Scores PHQ9 SCORE ONLY 03/18/2021 11/13/2019 05/30/2019  PHQ-9 Total Score 16 4 10     ---------------------------------------------------------------------------------------------------  Depression, Follow-up  She  was last seen for this 1 years ago. Changes made at last visit include none.   She reports excellent compliance with treatment. She is not having side effects.   She reports good tolerance of treatment. Current symptoms include: depressed mood, difficulty  concentrating, and insomnia She feels she is Unchanged since last visit.  Depression screen [facility] 2/9 03/18/2021 11/13/2019 05/30/2019  Decreased Interest 2 0 1  Down, Depressed, Hopeless 1 1 1   PHQ - 2 Score 3 1 2   Altered sleeping 3 2 1   Tired, decreased energy 3 1 3   Change in appetite 3 0 1  Feeling bad or failure about yourself  0 0 0  Trouble concentrating 2 0 2  Moving slowly or fidgety/restless 2 0 1  Suicidal thoughts 0 0 0  PHQ-9 Score 16 4 10   Difficult doing work/chores Extremely dIfficult Somewhat difficult Somewhat difficult    -----------------------------------------------------------------------------------------   Medications: Outpatient Medications Prior to Visit  Medication Sig   [DISCONTINUED] potassium citrate (UROCIT-K) 10 MEQ (1080 MG) SR tablet Take 2 tablets (20 mEq total) by mouth 2 (two) times daily with a meal. (Patient not taking: Reported on 03/18/2021)   [DISCONTINUED] tamsulosin (FLOMAX) 0.4 MG CAPS capsule Take 1 capsule (0.4 mg total) by mouth daily. (Patient not taking: Reported on 03/18/2021)   [DISCONTINUED] valACYclovir (VALTREX) 500 MG tablet Take by mouth. (Patient not taking: Reported on 03/18/2021)   [DISCONTINUED] venlafaxine XR (EFFEXOR-XR) 150 MG 24 hr capsule Take 1 capsule (150 mg total) by mouth daily with breakfast. Please tell patient she will need to be seen for more   No facility-administered medications prior to visit.    Review of Systems     Objective    BP 124/69    Pulse 68    Resp 16    Ht 5'  8" (1.727 m)    Wt 178 lb 6.4 oz (80.9 kg)    SpO2 100%    BMI 27.13 kg/m    Physical Exam Vitals and nursing note reviewed.  Constitutional:      General: She is awake. She is not in acute distress.    Appearance: Normal appearance. She is well-developed, well-groomed and overweight. She is not ill-appearing, toxic-appearing or diaphoretic.  HENT:     Head: Normocephalic and atraumatic.  Cardiovascular:     Rate and Rhythm: Normal  rate and regular rhythm.     Pulses: Normal pulses.     Heart sounds: Normal heart sounds. No murmur heard.   No friction rub. No gallop.  Pulmonary:     Effort: Pulmonary effort is normal. No respiratory distress.     Breath sounds: Normal breath sounds. No stridor. No wheezing, rhonchi or rales.  Chest:     Chest wall: No tenderness.  Abdominal:     General: Bowel sounds are normal.     Palpations: Abdomen is soft.  Musculoskeletal:        General: No swelling, tenderness, deformity or signs of injury. Normal range of motion.     Right lower leg: No edema.     Left lower leg: No edema.  Skin:    General: Skin is warm and dry.     Capillary Refill: Capillary refill takes less than 2 seconds.     Coloration: Skin is not jaundiced or pale.     Findings: No bruising, erythema, lesion or rash.  Neurological:     General: No focal deficit present.     Mental Status: She is alert and oriented to person, place, and time. Mental status is at baseline.     Cranial Nerves: No cranial nerve deficit.     Sensory: No sensory deficit.     Motor: No weakness.     Coordination: Coordination normal.  Psychiatric:        Mood and Affect: Mood normal.        Behavior: Behavior normal. Behavior is cooperative.        Thought Content: Thought content normal.        Judgment: Judgment normal.     No results found for any visits on 03/18/21.  Assessment & Plan     Problem List Items Addressed This Visit       Other   Depression, major, single episode, moderate ([place]) - Primary    I've explained to her that drugs of the SSRI class can have side effects such as weight gain, sexual dysfunction, insomnia, headache, nausea. These medications are generally effective at alleviating symptoms of anxiety and/or depression. Let me know if significant side effects do occur.  Will Start additional 75 mg daily Discussed potential side effects, incl GI upset, sexual dysfunction, increased anxiety, and  SI Discussed that it can take 6-8 weeks to reach full efficacy Contracted for safety - no SI/HI Discussed synergistic effects of medications and therapy       Relevant Medications   venlafaxine XR (EFFEXOR-XR) 150 MG 24 hr capsule   venlafaxine XR (EFFEXOR XR) 75 MG 24 hr capsule   GAD (generalized anxiety disorder)    I've explained to her that drugs of the SSRI class can have side effects such as weight gain, sexual dysfunction, insomnia, headache, nausea. These medications are generally effective at alleviating symptoms of anxiety and/or depression. Let me know if significant side effects do occur.  Will Start additional  75 mg daily Discussed potential side effects, incl GI upset, sexual dysfunction, increased anxiety, and SI Discussed that it can take 6-8 weeks to reach full efficacy Contracted for safety - no SI/HI Discussed synergistic effects of medications and therapy      Relevant Medications   venlafaxine XR (EFFEXOR-XR) 150 MG 24 hr capsule   venlafaxine XR (EFFEXOR XR) 75 MG 24 hr capsule   Avitaminosis D   Relevant Medications   Vitamin D, Ergocalciferol, (DRISDOL) 1.25 MG (50000 UNIT) CAPS capsule   Attention and concentration deficit    Did not wish to restart on Wellbutrin to assist with concentration deficit Report of working 7 days/week Referral to neuro psych to assist with adult ADD/ADHD testing Pt made aware of delay in appts       Relevant Orders   Ambulatory referral to Psychiatry   Colon cancer screening   Relevant Orders   Ambulatory referral to Gastroenterology     Return in about 3 months (around 06/16/2021) for annual examination- as schedule allows.      Vonna Kotyk, FNP, have reviewed all documentation for this visit. The documentation on 03/18/21 for the exam, diagnosis, procedures, and orders are all accurate and complete.  Patient seen and examined by Tally Joe,  FNP note scribed by [employer], [place], [place] [phone] (phone) [phone] (fax)  [place]

## 2021-03-19 ENCOUNTER — Other Ambulatory Visit: Payer: Self-pay

## 2021-03-19 DIAGNOSIS — Z1211 Encounter for screening for malignant neoplasm of colon: Secondary | ICD-10-CM

## 2021-03-19 MED ORDER — NA SULFATE-K SULFATE-MG SULF 17.5-3.13-1.6 GM/177ML PO SOLN: 1.0000 | Freq: Once | ORAL | 0 refills | Status: AC

## 2021-03-19 NOTE — Progress Notes (Signed)
Gastroenterology Pre-Procedure Review  Request Date: 02//232023 Requesting Physician: Dr. Servando Snare  PATIENT REVIEW QUESTIONS: The patient responded to the following health history questions as indicated:    1. Are you having any GI issues? no 2. Do you have a personal history of Polyps? no 3. Do you have a family history of Colon Cancer or Polyps? no 4. Diabetes Mellitus? no 5. Joint replacements in the past 12 months?no 6. Major health problems in the past 3 months?no 7. Any artificial heart valves, MVP, or defibrillator?no    MEDICATIONS & ALLERGIES:    Patient reports the following regarding taking any anticoagulation/antiplatelet therapy:   Plavix, Coumadin, Eliquis, Xarelto, Lovenox, Pradaxa, Brilinta, or Effient? no Aspirin? no  Patient confirms/reports the following medications:  Current Outpatient Medications  Medication Sig Dispense Refill   venlafaxine XR (EFFEXOR XR) 75 MG 24 hr capsule Take 1 capsule (75 mg total) by mouth daily with breakfast. 90 capsule 3   venlafaxine XR (EFFEXOR-XR) 150 MG 24 hr capsule Take 1 capsule (150 mg total) by mouth daily with breakfast. 90 capsule 3   Vitamin D, Ergocalciferol, (DRISDOL) 1.25 MG (50000 UNIT) CAPS capsule Take 1 capsule (50,000 Units total) by mouth every 7 (seven) days. 13 capsule 3   No current facility-administered medications for this visit.    Patient confirms/reports the following allergies:  No Known Allergies  No orders of the defined types were placed in this encounter.   AUTHORIZATION INFORMATION Primary Insurance: 1D#: Group #:  Secondary Insurance: 1D#: Group #:  SCHEDULE INFORMATION: Date: 04/15/2021 Time: Location:[facility]

## 2021-04-14 ENCOUNTER — Encounter: Payer: Self-pay | Admitting: Gastroenterology

## 2021-04-15 ENCOUNTER — Encounter
Admission: RE | Disposition: A | Discharge status: Home / Self Care | Payer: Self-pay | Source: Home / Self Care | Attending: Gastroenterology

## 2021-04-15 ENCOUNTER — Other Ambulatory Visit: Payer: Self-pay

## 2021-04-15 ENCOUNTER — Encounter: Payer: Self-pay | Admitting: Gastroenterology

## 2021-04-15 ENCOUNTER — Ambulatory Visit: Payer: Managed Care, Other (non HMO) | Admitting: Anesthesiology

## 2021-04-15 ENCOUNTER — Ambulatory Visit
Admission: RE | Admit: 2021-04-15 | Discharge: 2021-04-15 | Disposition: A | Discharge status: Home / Self Care | Payer: Managed Care, Other (non HMO) | Attending: Gastroenterology | Admitting: Gastroenterology

## 2021-04-15 DIAGNOSIS — K635 Polyp of colon: Secondary | ICD-10-CM

## 2021-04-15 DIAGNOSIS — Z1211 Encounter for screening for malignant neoplasm of colon: Secondary | ICD-10-CM | POA: Insufficient documentation

## 2021-04-15 DIAGNOSIS — F32A Depression, unspecified: Secondary | ICD-10-CM | POA: Insufficient documentation

## 2021-04-15 DIAGNOSIS — F419 Anxiety disorder, unspecified: Secondary | ICD-10-CM | POA: Insufficient documentation

## 2021-04-15 HISTORY — DX: Personal history of urinary calculi: Z87.442

## 2021-04-15 HISTORY — PX: COLONOSCOPY WITH PROPOFOL: SHX5780

## 2021-04-15 LAB — POCT PREGNANCY, URINE: Preg Test, Ur: NEGATIVE

## 2021-04-15 SURGERY — COLONOSCOPY WITH PROPOFOL
Anesthesia: General

## 2021-04-15 MED ORDER — SODIUM CHLORIDE 0.9 % IV SOLN: INTRAVENOUS | Status: DC

## 2021-04-15 MED ORDER — PROPOFOL 500 MG/50ML IV EMUL
INTRAVENOUS | Status: DC | PRN
  Administered 2021-04-15: 120 ug/kg/min via INTRAVENOUS

## 2021-04-15 MED ORDER — LIDOCAINE HCL (CARDIAC) PF 100 MG/5ML IV SOSY
PREFILLED_SYRINGE | INTRAVENOUS | Status: DC | PRN
  Administered 2021-04-15: 60 mg via INTRAVENOUS

## 2021-04-15 MED ORDER — PROPOFOL 10 MG/ML IV BOLUS
INTRAVENOUS | Status: DC | PRN
  Administered 2021-04-15: 30 mg via INTRAVENOUS
  Administered 2021-04-15: 80 mg via INTRAVENOUS
  Administered 2021-04-15: 20 mg via INTRAVENOUS

## 2021-04-15 NOTE — Anesthesia Preprocedure Evaluation (Addendum)
Anesthesia Evaluation  Patient identified by MRN, date of birth, ID band Patient awake    Reviewed: Allergy & Precautions, NPO status , Patient's Chart, lab work & pertinent test results  History of Anesthesia Complications Negative for: history of anesthetic complications  Airway Mallampati: III   Neck ROM: Full    Dental no notable dental hx.    Pulmonary neg pulmonary ROS,    Pulmonary exam normal breath sounds clear to auscultation       Cardiovascular Exercise Tolerance: Good negative cardio ROS Normal cardiovascular exam Rhythm:Regular Rate:Normal     Neuro/Psych  Headaches, PSYCHIATRIC DISORDERS Anxiety Depression    GI/Hepatic negative GI ROS,   Endo/Other  negative endocrine ROS  Renal/GU Renal disease (nephrolithiasis)     Musculoskeletal   Abdominal   Peds  Hematology negative hematology ROS (+)   Anesthesia Other Findings   Reproductive/Obstetrics                            Anesthesia Physical Anesthesia Plan  ASA: 2  Anesthesia Plan: General   Post-op Pain Management:    Induction: Intravenous  PONV Risk Score and Plan: 3 and Propofol infusion, TIVA and Treatment may vary due to age or medical condition  Airway Management Planned: Natural Airway  Additional Equipment:   Intra-op Plan:   Post-operative Plan:   Informed Consent: I have reviewed the patients History and Physical, chart, labs and discussed the procedure including the risks, benefits and alternatives for the proposed anesthesia with the patient or authorized representative who has indicated his/her understanding and acceptance.       Plan Discussed with: CRNA  Anesthesia Plan Comments: (LMA/GETA backup discussed.  Patient consented for risks of anesthesia including but not limited to:  - adverse reactions to medications - damage to eyes, teeth, lips or other oral mucosa - nerve damage due to  positioning  - sore throat or hoarseness - damage to heart, brain, nerves, lungs, other parts of body or loss of life  Informed patient about role of CRNA in peri- and intra-operative care.  Patient voiced understanding.)        Anesthesia Quick Evaluation

## 2021-04-15 NOTE — Anesthesia Postprocedure Evaluation (Signed)
Anesthesia Post Note  Patient: Susan Garner  Procedure(s) Performed: COLONOSCOPY WITH PROPOFOL  Patient location during evaluation: PACU Anesthesia Type: General Level of consciousness: awake and alert, oriented and patient cooperative Pain management: pain level controlled Vital Signs Assessment: post-procedure vital signs reviewed and stable Respiratory status: spontaneous breathing, nonlabored ventilation and respiratory function stable Cardiovascular status: blood pressure returned to baseline and stable Postop Assessment: adequate PO intake Anesthetic complications: no   No notable events documented.   Last Vitals:  Vitals:   04/15/21 0848 04/15/21 0920  BP: (!) 112/57 133/80  Pulse: 74 85  Resp: 20 13  Temp: (!) 36.1 C (!) 36.1 C  SpO2: 99% 100%    Last Pain:  Vitals:   04/15/21 0920  TempSrc: Temporal  PainSc:                  Reed Breech

## 2021-04-15 NOTE — Transfer of Care (Signed)
Immediate Anesthesia Transfer of Care Note  Patient: Susan Garner  Procedure(s) Performed: COLONOSCOPY WITH PROPOFOL  Patient Location: PACU  Anesthesia Type:General  Level of Consciousness: awake, alert  and oriented  Airway & Oxygen Therapy: Patient Spontanous Breathing  Post-op Assessment: Report given to RN and Post -op Vital signs reviewed and stable  Post vital signs: Reviewed and stable  Last Vitals:  Vitals Value Taken Time  BP    Temp    Pulse 83 04/15/21 0924  Resp 26 04/15/21 0924  SpO2 100 % 04/15/21 0924  Vitals shown include unvalidated device data.  Last Pain:  Vitals:   04/15/21 0848  TempSrc: Temporal  PainSc: 5          Complications: No notable events documented.

## 2021-04-15 NOTE — H&P (Signed)
Lucilla Lame, MD [facility] [address] [place], [place] [address] Phone: [phone] Fax : [phone]  Primary Care Physician:  Gwyneth Sprout, FNP Primary Gastroenterologist:  Dr. Allen Norris  Pre-Procedure History & Physical: HPI:  Susan Garner is a 51 y.o. female is here for a screening colonoscopy.   Past Medical History:  Diagnosis Date   Allergy    Anxiety    Clostridium difficile colitis 2009   Depression    Headache    History of kidney stones    Ovarian cyst     Past Surgical History:  Procedure Laterality Date   CYSTOSCOPY/URETEROSCOPY/HOLMIUM LASER/STENT PLACEMENT Right 01/07/2020   Procedure: CYSTOSCOPY/URETEROSCOPY/HOLMIUM LASER/STENT PLACEMENT;  Surgeon: Abbie Sons, MD;  Location: [facility] ORS;  Service: Urology;  Laterality: Right;   FEMUR FRACTURE SURGERY Right 1980's   FRACTURE SURGERY     KIDNEY STONE SURGERY     TONSILLECTOMY     TUBAL LIGATION      Prior to Admission medications   Medication Sig Start Date End Date Taking? Authorizing Provider  venlafaxine XR (EFFEXOR XR) 75 MG 24 hr capsule Take 1 capsule (75 mg total) by mouth daily with breakfast. 03/18/21  Yes Gwyneth Sprout, FNP  venlafaxine XR (EFFEXOR-XR) 150 MG 24 hr capsule Take 1 capsule (150 mg total) by mouth daily with breakfast. 03/18/21  Yes Gwyneth Sprout, FNP  Vitamin D, Ergocalciferol, (DRISDOL) 1.25 MG (50000 UNIT) CAPS capsule Take 1 capsule (50,000 Units total) by mouth every 7 (seven) days. 03/18/21  Yes Gwyneth Sprout, FNP    Allergies as of 03/19/2021   (No Known Allergies)    Family History  Problem Relation Age of Onset   Anxiety disorder Mother    Cancer Neg Hx    Diabetes Neg Hx    Heart disease Neg Hx    Breast cancer Neg Hx     Social History   Socioeconomic History   Marital status: Single    Spouse name: Not on file   Number of children: Not on file   Years of education: Not on file   Highest education level: Not on file  Occupational History    Not on file  Tobacco Use   Smoking status: Never   Smokeless tobacco: Never  Vaping Use   Vaping Use: Never used  Substance and Sexual Activity   Alcohol use: No    Alcohol/week: 0.0 standard drinks   Drug use: No   Sexual activity: Not Currently    Birth control/protection: Surgical  Other Topics Concern   Not on file  Social History Narrative   Not on file   Social Determinants of Health   Financial Resource Strain: Not on file  Food Insecurity: Not on file  Transportation Needs: Not on file  Physical Activity: Not on file  Stress: Not on file  Social Connections: Not on file  Intimate Partner Violence: Not on file    Review of Systems: See HPI, otherwise negative ROS  Physical Exam: BP (!) 112/57    Pulse 74    Temp (!) 96.9 F (36.1 C) (Temporal)    Resp 20    Ht 5\' 8"  (1.727 m)    Wt 77.1 kg    SpO2 99%    BMI 25.85 kg/m  General:   Alert,  pleasant and cooperative in NAD Head:  Normocephalic and atraumatic. Neck:  Supple; no masses or thyromegaly. Lungs:  Clear throughout to auscultation.    Heart:  Regular rate  and rhythm. Abdomen:  Soft, nontender and nondistended. Normal bowel sounds, without guarding, and without rebound.   Neurologic:  Alert and  oriented x4;  grossly normal neurologically.  Impression/Plan: Susan Garner is now here to undergo a screening colonoscopy.  Risks, benefits, and alternatives regarding colonoscopy have been reviewed with the patient.  Questions have been answered.  All parties agreeable.

## 2021-04-15 NOTE — Op Note (Signed)
[facility] Gastroenterology Patient Name: Susan Garner Procedure Date: 04/15/2021 9:01 AM MRN: [id number] Account #: [id number] Date of Birth: September 28, 1970 Admit Type: Outpatient Age: 51 Room: [facility] ENDO ROOM 4 Gender: Female Note Status: Finalized Instrument Name: Jasper Riling [id number] Procedure:             Colonoscopy Indications:           Screening for colorectal malignant neoplasm Providers:             Lucilla Lame MD, MD Referring MD:          Dr Rollene Rotunda, MD (Referring MD) Medicines:             Propofol per Anesthesia Complications:         No immediate complications. Procedure:             Pre-Anesthesia Assessment:                        - Prior to the procedure, a History and Physical was                         performed, and patient medications and allergies were                         reviewed. The patient's tolerance of previous                         anesthesia was also reviewed. The risks and benefits                         of the procedure and the sedation options and risks                         were discussed with the patient. All questions were                         answered, and informed consent was obtained. Prior                         Anticoagulants: The patient has taken no previous                         anticoagulant or antiplatelet agents. ASA Grade                         Assessment: II - A patient with mild systemic disease.                         After reviewing the risks and benefits, the patient                         was deemed in satisfactory condition to undergo the                         procedure.                        After obtaining informed consent, the colonoscope was  passed under direct vision. Throughout the procedure,                         the patient's blood pressure, pulse, and oxygen                         saturations were monitored continuously. The                          Colonoscope was introduced through the anus and                         advanced to the the cecum, identified by appendiceal                         orifice and ileocecal valve. The colonoscopy was                         performed without difficulty. The patient tolerated                         the procedure well. The quality of the bowel                         preparation was good. Findings:      The perianal and digital rectal examinations were normal.      A 2 mm polyp was found in the ascending colon. The polyp was sessile.       The polyp was removed with a cold biopsy forceps. Resection and       retrieval were complete.      A 2 mm polyp was found in the sigmoid colon. The polyp was sessile. The       polyp was removed with a cold biopsy forceps. Resection and retrieval       were complete. Impression:            - One 2 mm polyp in the ascending colon, removed with                         a cold biopsy forceps. Resected and retrieved.                        - One 2 mm polyp in the sigmoid colon, removed with a                         cold biopsy forceps. Resected and retrieved. Recommendation:        - Discharge patient to home.                        - Resume previous diet.                        - Continue present medications.                        - Await pathology results.                        - If the pathology report reveals adenomatous tissue,  then repeat the colonoscopy for surveillance in 7                         years. Procedure Code(s):     --- Professional ---                        [phone], Colonoscopy, flexible; with biopsy, single or                         multiple Diagnosis Code(s):     --- Professional ---                        Z12.11, Encounter for screening for malignant neoplasm                         of colon                        K63.5, Polyp of colon CPT copyright 2019 [employer]. All rights  reserved. The codes documented in this report are preliminary and upon coder review may  be revised to meet current compliance requirements. Lucilla Lame MD, MD 04/15/2021 9:23:40 AM This report has been signed electronically. Number of Addenda: 0 Note Initiated On: 04/15/2021 9:01 AM Scope Withdrawal Time: 0 hours 6 minutes 58 seconds  Total Procedure Duration: 0 hours 12 minutes 7 seconds  Estimated Blood Loss:  Estimated blood loss: none.      [facility]

## 2021-04-16 ENCOUNTER — Encounter: Payer: Self-pay | Admitting: Gastroenterology

## 2021-04-16 LAB — SURGICAL PATHOLOGY

## 2021-04-28 ENCOUNTER — Other Ambulatory Visit: Payer: Self-pay

## 2021-04-28 ENCOUNTER — Encounter: Payer: Self-pay | Admitting: Urology

## 2021-04-28 ENCOUNTER — Ambulatory Visit (INDEPENDENT_AMBULATORY_CARE_PROVIDER_SITE_OTHER): Payer: Managed Care, Other (non HMO) | Admitting: Urology

## 2021-04-28 VITALS — BP 130/83 | HR 70 | Ht 68.0 in | Wt 170.0 lb

## 2021-04-28 DIAGNOSIS — Z87442 Personal history of urinary calculi: Secondary | ICD-10-CM | POA: Diagnosis not present

## 2021-04-28 DIAGNOSIS — Z09 Encounter for follow-up examination after completed treatment for conditions other than malignant neoplasm: Secondary | ICD-10-CM | POA: Diagnosis not present

## 2021-04-28 MED ORDER — OXYCODONE-ACETAMINOPHEN 5-325 MG PO TABS
1.0000 | ORAL_TABLET | Freq: Four times a day (QID) | ORAL | 0 refills | Status: DC | PRN
Start: 2021-04-28 — End: 2022-02-08

## 2021-04-28 NOTE — Progress Notes (Signed)
? ?04/28/2021 ?1:38 PM  ? ?Susan Garner ?25-Dec-1970 ?[id number] ? ?Referring provider: [name], PA-C ?[phone] ?[address] ?[place],  [place] [address] ? ?Chief Complaint  ?Patient presents with  ? Nephrolithiasis  ? ? ?Urologic history: ?1.  Recurrent stone disease ?-Passed calculus 2017 ?-Ureteroscopic removal 8 mm right proximal calculus 12/2019 ?-Metabolic evaluation remarkable for low urine volume at 350 mL and hypocitraturia ?-On potassium citrate ?-Prior stone analysis CaOxMono/CaOxDi 80/20 ? ?HPI: ?51 y.o. female presents for follow-up. ? ?Doing well since last visit ?No bothersome LUTS ?Denies dysuria, gross hematuria ?Denies flank, abdominal or pelvic pain ? ? ?PMH: ?Past Medical History:  ?Diagnosis Date  ? Allergy   ? Anxiety   ? Clostridium difficile colitis 2009  ? Depression   ? Headache   ? History of kidney stones   ? Ovarian cyst   ? ? ?Surgical History: ?Past Surgical History:  ?Procedure Laterality Date  ? COLONOSCOPY WITH PROPOFOL N/A 04/15/2021  ? Procedure: COLONOSCOPY WITH PROPOFOL;  Surgeon: Midge Minium, MD;  Location: [facility] ENDOSCOPY;  Service: Endoscopy;  Laterality: N/A;  ? CYSTOSCOPY/URETEROSCOPY/HOLMIUM LASER/STENT PLACEMENT Right 01/07/2020  ? Procedure: CYSTOSCOPY/URETEROSCOPY/HOLMIUM LASER/STENT PLACEMENT;  Surgeon: Riki Altes, MD;  Location: [facility] ORS;  Service: Urology;  Laterality: Right;  ? FEMUR FRACTURE SURGERY Right 1980's  ? FRACTURE SURGERY    ? KIDNEY STONE SURGERY    ? TONSILLECTOMY    ? TUBAL LIGATION    ? ? ?Home Medications:  ?Allergies as of 04/28/2021   ?No Known Allergies ?  ? ?  ?Medication List  ?  ? ?  ? Accurate as of April 28, 2021  1:38 PM. If you have any questions, ask your nurse or doctor.  ?  ?  ? ?  ? ?venlafaxine XR 150 MG 24 hr capsule ?Commonly known as: EFFEXOR-XR ?Take 1 capsule (150 mg total) by mouth daily with breakfast. ?  ?venlafaxine XR 75 MG 24 hr capsule ?Commonly known as: Effexor XR ?Take 1 capsule (75 mg total) by  mouth daily with breakfast. ?  ?Vitamin D (Ergocalciferol) 1.25 MG (50000 UNIT) Caps capsule ?Commonly known as: DRISDOL ?Take 1 capsule (50,000 Units total) by mouth every 7 (seven) days. ?  ? ?  ? ? ?Allergies: No Known Allergies ? ?Family History: ?Family History  ?Problem Relation Age of Onset  ? Anxiety disorder Mother   ? Cancer Neg Hx   ? Diabetes Neg Hx   ? Heart disease Neg Hx   ? Breast cancer Neg Hx   ? ? ?Social History:  reports that she has never smoked. She has never used smokeless tobacco. She reports that she does not drink alcohol and does not use drugs. ? ? ?Physical Exam: ?BP 130/83   Pulse 70   Ht 5\' 8"  (1.727 m)   Wt 170 lb (77.1 kg)   BMI 25.85 kg/m?   ?Constitutional:  Alert and oriented, No acute distress. ?HEENT: [place] AT, moist mucus membranes.  Trachea midline, no masses. ?Cardiovascular: No clubbing, cyanosis, or edema. ?Respiratory: Normal respiratory effort, no increased work of breathing. ? ? ?Assessment & Plan:   ? ?1.  Recurrent nephrolithiasis ?KUB today and will call with results ?Follow-up 1 year with KUB ?Instructed to call earlier for recurrent renal colic ?Limited Rx oxycodone (#3 tabs) given in the event of after hours renal colic with hopes of avoiding an ED visit ? ? ? , MD ? ?[facility] ?[address] ?[place],  [place] [address] ?[phone] ? ? ?

## 2021-05-27 ENCOUNTER — Other Ambulatory Visit: Payer: Self-pay | Admitting: Family Medicine

## 2021-05-27 DIAGNOSIS — F321 Major depressive disorder, single episode, moderate: Secondary | ICD-10-CM

## 2021-05-27 DIAGNOSIS — F411 Generalized anxiety disorder: Secondary | ICD-10-CM

## 2021-05-28 NOTE — Telephone Encounter (Signed)
rx was sent to pharmacy on 03/18/21 #90/3 which is for 90 day supply ? ?Requested Prescriptions  ?Refused Prescriptions Disp Refills  ?? venlafaxine XR (EFFEXOR-XR) 150 MG 24 hr capsule [Pharmacy Med Name: VENLAFAXINE HCL ER 150 MG CAP] 90 capsule 4  ?  Sig: TAKE 1 CAPSULE BY MOUTH DAILY WITH BREAKFAST.  ?  ? Psychiatry: Antidepressants - SNRI - desvenlafaxine & venlafaxine Failed - 05/27/2021 12:36 PM  ?  ?  Failed - Cr in normal range and within 360 days  ?  Creatinine, Ser  ?Date Value Ref Range Status  ?01/04/2020 0.87 0.44 - 1.00 mg/dL Final  ?   ?  ?  Failed - Lipid Panel in normal range within the last 12 months  ?  No results found for: CHOL, POCCHOL, CHOLTOT ?No results found for: LDLCALC, LDLC, HIRISKLDL, POCLDL, LDLDIRECT, REALLDLC, TOTLDLC ?No results found for: HDL, POCHDL ?No results found for: TRIG, POCTRIG ?  ?  ?  Passed - Completed PHQ-2 or PHQ-9 in the last 360 days  ?  ?  Passed - Last BP in normal range  ?  BP Readings from Last 1 Encounters:  ?04/28/21 130/83  ?   ?  ?  Passed - Valid encounter within last 6 months  ?  Recent Outpatient Visits   ?      ? 2 months ago Depression, major, single episode, moderate ([place])  ? [facility] Tally Joe T, FNP  ? 1 year ago Rib pain on left side  ? [place], [place]  ? 1 year ago GAD (generalized anxiety disorder)  ? [place], [place]  ? 2 years ago BMI 26.0-26.9,adult  ? [place], [place]  ? 5 years ago Severe episode of recurrent major depressive disorder, without psychotic features ([place])  ? [facility] Roselee Nova, MD  ?  ?  ? ?  ?  ?  ? ? ?

## 2021-06-24 ENCOUNTER — Other Ambulatory Visit: Payer: Self-pay | Admitting: Family Medicine

## 2021-06-24 DIAGNOSIS — F321 Major depressive disorder, single episode, moderate: Secondary | ICD-10-CM

## 2021-06-24 DIAGNOSIS — F411 Generalized anxiety disorder: Secondary | ICD-10-CM

## 2021-06-24 NOTE — Telephone Encounter (Signed)
Refilled 03/18/2021 #90 3 refills - 1 year supply. ?Requested Prescriptions  ?Pending Prescriptions Disp Refills  ?? venlafaxine XR (EFFEXOR-XR) 150 MG 24 hr capsule [Pharmacy Med Name: VENLAFAXINE HCL ER 150 MG CAP] 90 capsule 4  ?  Sig: TAKE 1 CAPSULE BY MOUTH DAILY WITH BREAKFAST.  ?  ? Psychiatry: Antidepressants - SNRI - desvenlafaxine & venlafaxine Failed - 06/24/2021  9:32 AM  ?  ?  Failed - Cr in normal range and within 360 days  ?  Creatinine, Ser  ?Date Value Ref Range Status  ?01/04/2020 0.87 0.44 - 1.00 mg/dL Final  ?   ?  ?  Failed - Lipid Panel in normal range within the last 12 months  ?  No results found for: CHOL, POCCHOL, CHOLTOT ?No results found for: LDLCALC, LDLC, HIRISKLDL, POCLDL, LDLDIRECT, REALLDLC, TOTLDLC ?No results found for: HDL, POCHDL ?No results found for: TRIG, POCTRIG ?  ?  ?  Passed - Completed PHQ-2 or PHQ-9 in the last 360 days  ?  ?  Passed - Last BP in normal range  ?  BP Readings from Last 1 Encounters:  ?04/28/21 130/83  ?   ?  ?  Passed - Valid encounter within last 6 months  ?  Recent Outpatient Visits   ?      ? 3 months ago Depression, major, single episode, moderate (HCC)  ? [facility] Merita Norton T, FNP  ? 1 year ago Rib pain on left side  ? [facility], [place], [place]  ? 2 years ago GAD (generalized anxiety disorder)  ? [facility], [place], [place]  ? 2 years ago BMI 26.0-26.9,adult  ? [facility] [place], [place], [place]  ? 5 years ago Severe episode of recurrent major depressive disorder, without psychotic features (HCC)  ? [facility] Ellyn Hack, MD  ?  ?  ? ?  ?  ?  ? ?

## 2021-07-25 ENCOUNTER — Other Ambulatory Visit: Payer: Self-pay | Admitting: Family Medicine

## 2021-07-25 DIAGNOSIS — F411 Generalized anxiety disorder: Secondary | ICD-10-CM

## 2021-07-25 DIAGNOSIS — F321 Major depressive disorder, single episode, moderate: Secondary | ICD-10-CM

## 2021-09-25 ENCOUNTER — Other Ambulatory Visit: Payer: Self-pay | Admitting: Family Medicine

## 2021-09-25 DIAGNOSIS — F411 Generalized anxiety disorder: Secondary | ICD-10-CM

## 2021-09-25 DIAGNOSIS — F321 Major depressive disorder, single episode, moderate: Secondary | ICD-10-CM

## 2021-11-19 ENCOUNTER — Encounter: Payer: Self-pay | Admitting: Urology

## 2021-11-22 ENCOUNTER — Other Ambulatory Visit: Payer: Self-pay | Admitting: Family Medicine

## 2021-11-22 DIAGNOSIS — F411 Generalized anxiety disorder: Secondary | ICD-10-CM

## 2021-11-22 DIAGNOSIS — F321 Major depressive disorder, single episode, moderate: Secondary | ICD-10-CM

## 2021-12-07 ENCOUNTER — Other Ambulatory Visit: Payer: Self-pay | Admitting: Family Medicine

## 2021-12-07 ENCOUNTER — Other Ambulatory Visit: Payer: Self-pay | Admitting: Obstetrics and Gynecology

## 2021-12-07 DIAGNOSIS — Z1231 Encounter for screening mammogram for malignant neoplasm of breast: Secondary | ICD-10-CM

## 2022-01-07 ENCOUNTER — Encounter: Payer: Self-pay | Admitting: Radiology

## 2022-01-07 ENCOUNTER — Ambulatory Visit
Admission: RE | Admit: 2022-01-07 | Discharge: 2022-01-07 | Disposition: A | Discharge status: Home / Self Care | Payer: Managed Care, Other (non HMO) | Source: Ambulatory Visit | Attending: Obstetrics and Gynecology | Admitting: Obstetrics and Gynecology

## 2022-01-07 DIAGNOSIS — Z1231 Encounter for screening mammogram for malignant neoplasm of breast: Secondary | ICD-10-CM | POA: Diagnosis present

## 2022-02-08 ENCOUNTER — Telehealth (INDEPENDENT_AMBULATORY_CARE_PROVIDER_SITE_OTHER): Payer: Managed Care, Other (non HMO) | Admitting: Family Medicine

## 2022-02-08 ENCOUNTER — Encounter: Payer: Self-pay | Admitting: Family Medicine

## 2022-02-08 DIAGNOSIS — R5381 Other malaise: Secondary | ICD-10-CM | POA: Insufficient documentation

## 2022-02-08 DIAGNOSIS — R5383 Other fatigue: Secondary | ICD-10-CM

## 2022-02-08 MED ORDER — PREDNISONE 20 MG PO TABS: 40.0000 mg | ORAL_TABLET | Freq: Every day | ORAL | 0 refills | Status: DC

## 2022-02-08 NOTE — Progress Notes (Signed)
I,Connie R Striblin,acting as a Neurosurgeon for Jacky Kindle, FNP.,have documented all relevant documentation on the behalf of Jacky Kindle, FNP,as directed by  Jacky Kindle, FNP while in the presence of Jacky Kindle, FNP.  MyChart Video Visit    Virtual Visit via Video Note   This format is felt to be most appropriate for this patient at this time. Physical exam was limited by quality of the video and audio technology used for the visit.   Patient location: Home  Provider location: [facility]  I discussed the limitations of evaluation and management by telemedicine and the availability of in person appointments. The patient expressed understanding and agreed to proceed.  Patient: Susan Garner   DOB: December 09, 1970   51 y.o. Female  MRN: [id number] Visit Date: 02/08/2022  Today's healthcare provider: Jacky Kindle, FNP   Chief Complaint  Patient presents with   Cough        Subjective    HPI HPI     Cough    Additional comments:        Last edited by Consuello Closs, CMA on 02/08/2022  1:14 PM.      Cough: Patient complains of cough. Symptoms began 2 days ago. Cough described as non-productive, worsening over time. Patient denies sneezing. Associated symptoms include chills, fever, nasal congestion, and sore throat. .   Current treatments have included  otc pain and cough  medication , with poor improvement.    Pt has no know exposure to th Flu or Covid Pt has had constant 101 fever x2 days and is unable to get out of the bed due to fever and body aches.  Pt believes that she has the flu, no at home testing done at this time.     Medications: Outpatient Medications Prior to Visit  Medication Sig   venlafaxine XR (EFFEXOR XR) 75 MG 24 hr capsule Take 1 capsule (75 mg total) by mouth daily with breakfast.   venlafaxine XR (EFFEXOR-XR) 150 MG 24 hr capsule Take 1 capsule (150 mg total) by mouth daily with breakfast. Please schedule office  visit before any future refill.   [DISCONTINUED] oxyCODONE-acetaminophen (PERCOCET/ROXICET) 5-325 MG tablet Take 1 tablet by mouth every 6 (six) hours as needed for severe pain (Renal colic). (Patient not taking: Reported on 02/08/2022)   [DISCONTINUED] Vitamin D, Ergocalciferol, (DRISDOL) 1.25 MG (50000 UNIT) CAPS capsule Take 1 capsule (50,000 Units total) by mouth every 7 (seven) days. (Patient not taking: Reported on 02/08/2022)   No facility-administered medications prior to visit.    Review of Systems   Objective    There were no vitals taken for this visit.  Physical Exam Constitutional:      Appearance: She is ill-appearing. She is not toxic-appearing.     Comments: Wet washcloth on forehead, supine, in bed  Pulmonary:     Effort: Pulmonary effort is normal.  Neurological:     General: No focal deficit present.     Mental Status: She is alert and oriented to person, place, and time. Mental status is at baseline.  Psychiatric:        Mood and Affect: Mood normal.        Behavior: Behavior normal.        Thought Content: Thought content normal.        Judgment: Judgment normal.        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     I discussed the assessment and treatment plan with the patient. The patient was provided an opportunity to ask questions and all were answered. The patient agreed with the plan and demonstrated an understanding of the  instructions.   The patient was advised to call back or seek an in-person evaluation if the symptoms worsen or if the condition fails to improve as anticipated.  I provided 10 minutes of face-to-face time during this encounter regarding viral illness, unknown cause, current symptoms and plan of care.  Leilani Merl, FNP, have reviewed all documentation for this visit. The documentation on 02/08/22 for the exam, diagnosis, procedures, and orders are all accurate and complete.   Jacky Kindle, FNP [facility] [phone] (phone) [phone] (fax)  [facility]

## 2022-02-08 NOTE — Assessment & Plan Note (Signed)
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

## 2022-02-22 ENCOUNTER — Other Ambulatory Visit: Payer: Self-pay | Admitting: Family Medicine

## 2022-02-22 DIAGNOSIS — F321 Major depressive disorder, single episode, moderate: Secondary | ICD-10-CM

## 2022-02-22 DIAGNOSIS — F411 Generalized anxiety disorder: Secondary | ICD-10-CM

## 2022-02-22 NOTE — Telephone Encounter (Signed)
Called Pt Susan Garner to schedule appointment.

## 2022-03-28 ENCOUNTER — Other Ambulatory Visit: Payer: Self-pay | Admitting: Family Medicine

## 2022-03-28 DIAGNOSIS — F321 Major depressive disorder, single episode, moderate: Secondary | ICD-10-CM

## 2022-03-28 DIAGNOSIS — F411 Generalized anxiety disorder: Secondary | ICD-10-CM

## 2022-03-28 NOTE — Telephone Encounter (Signed)
Attempted to reach patient regarding appt for rx refill, VM was full.  Last OV for depression  was 03/18/21

## 2022-04-01 ENCOUNTER — Other Ambulatory Visit: Payer: Self-pay | Admitting: Family Medicine

## 2022-04-01 DIAGNOSIS — F321 Major depressive disorder, single episode, moderate: Secondary | ICD-10-CM

## 2022-04-01 DIAGNOSIS — F411 Generalized anxiety disorder: Secondary | ICD-10-CM

## 2022-04-01 NOTE — Telephone Encounter (Signed)
Requested medications are due for refill today.  no  Requested medications are on the active medications list.  yes  Last refill. 02/23/2022 #30   Future visit scheduled.   no  Notes to clinic.  Courtesy refill already given.    Requested Prescriptions  Pending Prescriptions Disp Refills   venlafaxine XR (EFFEXOR-XR) 150 MG 24 hr capsule [Pharmacy Med Name: VENLAFAXINE HCL ER 150 MG CAP] 30 capsule 0    Sig: Take 1 capsule (150 mg total) by mouth daily with breakfast. Please schedule office visit before any future refill.     Psychiatry: Antidepressants - SNRI - desvenlafaxine & venlafaxine Failed - 04/01/2022  4:33 AM      Failed - Cr in normal range and within 360 days    Creatinine, Ser  Date Value Ref Range Status  01/04/2020 0.87 0.44 - 1.00 mg/dL Final         Failed - Completed PHQ-2 or PHQ-9 in the last 360 days      Failed - Lipid Panel in normal range within the last 12 months    No results found for: "CHOL", "POCCHOL", "CHOLTOT" No results found for: "LDLCALC", "LDLC", "HIRISKLDL", "POCLDL", "LDLDIRECT", "REALLDLC", "TOTLDLC" No results found for: "HDL", "POCHDL" No results found for: "TRIG", "POCTRIG"       Passed - Last BP in normal range    BP Readings from Last 1 Encounters:  04/28/21 130/83         Passed - Valid encounter within last 6 months    Recent Outpatient Visits           1 month ago [place] and fatigue   [place] Tally Joe T, FNP   1 year ago Depression, major, single episode, moderate [facility])   [place] Tally Joe T, FNP   2 years ago Rib pain on left side   [place], [place]rel, [place]   2 years ago GAD (generalized anxiety disorder)   [place], [place], [place]   2 years ago BMI 26.0-26.9,adult   [facility] [place], [place], [place]       Future Appointments              In 1 month Stoioff, Ronda Fairly, MD [place]

## 2022-04-06 ENCOUNTER — Other Ambulatory Visit: Payer: Self-pay | Admitting: Family Medicine

## 2022-04-06 DIAGNOSIS — F411 Generalized anxiety disorder: Secondary | ICD-10-CM

## 2022-04-06 DIAGNOSIS — F321 Major depressive disorder, single episode, moderate: Secondary | ICD-10-CM

## 2022-04-06 MED ORDER — VENLAFAXINE HCL ER 75 MG PO CP24: 75.0000 mg | ORAL_CAPSULE | Freq: Every day | ORAL | 0 refills | Status: DC

## 2022-04-06 MED ORDER — VENLAFAXINE HCL ER 150 MG PO CP24: 150.0000 mg | ORAL_CAPSULE | Freq: Every day | ORAL | 0 refills | Status: DC

## 2022-04-06 NOTE — Telephone Encounter (Signed)
Please advise 

## 2022-04-06 NOTE — Telephone Encounter (Signed)
Pt called and is requesting to have this filled today, she is completely out and says she cannot be without this Rx. Please advise, says she is willing to schedule an appt if needed.

## 2022-04-13 NOTE — Progress Notes (Unsigned)
I,Aundray Cartlidge R Annaliza Zia,acting as a Education administrator for Gwyneth Sprout, FNP.,have documented all relevant documentation on the behalf of Gwyneth Sprout, FNP,as directed by  Gwyneth Sprout, FNP while in the presence of Gwyneth Sprout, FNP.  Established patient visit  Patient: Susan Garner   DOB: 01-21-1971   52 y.o. Female  MRN: [id number] Visit Date: 04/14/2022  Today's healthcare provider: Gwyneth Sprout, FNP  Re Introduced to nurse practitioner role and practice setting.  All questions answered.  Discussed provider/patient relationship and expectations.  Chief Complaint  Patient presents with   Follow-up   Subjective    HPI  Depression/ Anxiety Follow-up  She  was last seen for this 1 years ago. Changes made at last visit include:Will Start additional 75 mg daily Discussed potential side effects, incl GI upset, sexual dysfunction, increased anxiety, and SI Discussed that it can take 6-8 weeks to reach full efficacy.   She reports excellent compliance with treatment. She is not having side effects.   She reports excellent tolerance of treatment. Current symptoms include:  none She feels she is Unchanged since last visit.     04/14/2022    8:18 AM 03/18/2021    1:49 PM 11/13/2019   11:25 AM  Depression screen PHQ 2/9  Decreased Interest 0 2 0  Down, Depressed, Hopeless 0 1 1  PHQ - 2 Score 0 3 1  Altered sleeping 1 3 2  $ Tired, decreased energy 1 3 1  $ Change in appetite 1 3 0  Feeling bad or failure about yourself  0 0 0  Trouble concentrating 1 2 0  Moving slowly or fidgety/restless 0 2 0  Suicidal thoughts 0 0 0  PHQ-9 Score 4 16 4  $ Difficult doing work/chores Somewhat difficult Extremely dIfficult Somewhat difficult    -----------------------------------------------------------------------------------------   Medications: Outpatient Medications Prior to Visit  Medication Sig   [DISCONTINUED] venlafaxine XR (EFFEXOR XR) 75 MG 24 hr capsule Take 1 capsule (75 mg total)  by mouth daily with breakfast. Must be seen within next 30 days (Patient not taking: Reported on 04/14/2022)   [DISCONTINUED] venlafaxine XR (EFFEXOR-XR) 150 MG 24 hr capsule Take 1 capsule (150 mg total) by mouth daily with breakfast. Must be seen within next 30 days   No facility-administered medications prior to visit.    Review of Systems     Objective    BP 112/76 (BP Location: Right Arm, Patient Position: Sitting, Cuff Size: Normal)   Pulse 73   Temp 98.6 F (37 C) (Oral)   Wt 182 lb 4.8 oz (82.7 kg)   SpO2 100%   BMI 27.72 kg/m    Physical Exam Vitals and nursing note reviewed.  Constitutional:      General: She is not in acute distress.    Appearance: Normal appearance. She is overweight. She is not ill-appearing, toxic-appearing or diaphoretic.  HENT:     Head: Normocephalic and atraumatic.  Cardiovascular:     Rate and Rhythm: Normal rate and regular rhythm.     Pulses: Normal pulses.     Heart sounds: Normal heart sounds. No murmur heard.    No friction rub. No gallop.  Pulmonary:     Effort: Pulmonary effort is normal. No respiratory distress.     Breath sounds: Normal breath sounds. No stridor. No wheezing, rhonchi or rales.  Chest:     Chest wall: No tenderness.  Musculoskeletal:        General: No swelling, tenderness, deformity or  signs of injury. Normal range of motion.     Right lower leg: No edema.     Left lower leg: No edema.  Skin:    General: Skin is warm and dry.     Capillary Refill: Capillary refill takes less than 2 seconds.     Coloration: Skin is not jaundiced or pale.     Findings: No bruising, erythema, lesion or rash.  Neurological:     General: No focal deficit present.     Mental Status: She is alert and oriented to person, place, and time. Mental status is at baseline.     Cranial Nerves: No cranial nerve deficit.     Sensory: No sensory deficit.     Motor: No weakness.     Coordination: Coordination normal.  Psychiatric:         Mood and Affect: Mood normal.        Behavior: Behavior normal.        Thought Content: Thought content normal.        Judgment: Judgment normal.     No results found for any visits on 04/14/22.  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     Vonna Kotyk, FNP, have reviewed all documentation for this visit. The documentation on 04/14/22 for the exam, diagnosis, procedures, and orders are all accurate and complete.  Gwyneth Sprout, [place] [phone] (phone) [phone] (fax)  [place]

## 2022-04-14 ENCOUNTER — Ambulatory Visit (INDEPENDENT_AMBULATORY_CARE_PROVIDER_SITE_OTHER): Payer: Managed Care, Other (non HMO) | Admitting: Family Medicine

## 2022-04-14 ENCOUNTER — Encounter: Payer: Self-pay | Admitting: Family Medicine

## 2022-04-14 VITALS — BP 112/76 | HR 73 | Temp 98.6°F | Wt 182.3 lb

## 2022-04-14 DIAGNOSIS — F339 Major depressive disorder, recurrent, unspecified: Secondary | ICD-10-CM

## 2022-04-14 MED ORDER — VENLAFAXINE HCL ER 150 MG PO CP24: 150.0000 mg | ORAL_CAPSULE | Freq: Every day | ORAL | 3 refills | Status: DC

## 2022-04-14 NOTE — Assessment & Plan Note (Signed)
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

## 2022-04-29 ENCOUNTER — Ambulatory Visit: Payer: Managed Care, Other (non HMO) | Admitting: Urology

## 2022-05-02 ENCOUNTER — Other Ambulatory Visit: Payer: Self-pay | Admitting: Family Medicine

## 2022-05-02 DIAGNOSIS — F339 Major depressive disorder, recurrent, unspecified: Secondary | ICD-10-CM

## 2022-05-03 NOTE — Telephone Encounter (Signed)
Refilled 04/14/2022 #90 3 rf Requested Prescriptions  Refused Prescriptions Disp Refills   venlafaxine XR (EFFEXOR-XR) 150 MG 24 hr capsule [Pharmacy Med Name: VENLAFAXINE HCL ER 150 MG CAP] 30 capsule     Sig: TAKE 1 CAPSULE (150 MG TOTAL) BY MOUTH DAILY WITH BREAKFAST. MUST BE SEEN WITHIN NEXT 30 DAYS     Psychiatry: Antidepressants - SNRI - desvenlafaxine & venlafaxine Failed - 05/02/2022  1:57 AM      Failed - Cr in normal range and within 360 days    Creatinine, Ser  Date Value Ref Range Status  01/04/2020 0.87 0.44 - 1.00 mg/dL Final         Failed - Lipid Panel in normal range within the last 12 months    No results found for: "CHOL", "POCCHOL", "CHOLTOT" No results found for: "LDLCALC", "LDLC", "HIRISKLDL", "POCLDL", "LDLDIRECT", "REALLDLC", "TOTLDLC" No results found for: "HDL", "POCHDL" No results found for: "TRIG", "POCTRIG"       Passed - Completed PHQ-2 or PHQ-9 in the last 360 days      Passed - Last BP in normal range    BP Readings from Last 1 Encounters:  04/14/22 112/76         Passed - Valid encounter within last 6 months    Recent Outpatient Visits           2 weeks ago Major depressive disorder, recurrent episode with anxious distress [facility])   [place] Gwyneth Sprout, FNP   2 months ago [place] and fatigue   [place] Tally Joe T, FNP   1 year ago Depression, major, single episode, moderate [facility])   [place] Tally Joe T, FNP   2 years ago Rib pain on left side   [place], [place], [place]   2 years ago GAD (generalized anxiety disorder)   [place], [place], [place]       Future Appointments             In 3 days Stoioff, Ronda Fairly, MD [place]

## 2022-05-05 ENCOUNTER — Other Ambulatory Visit: Payer: Self-pay | Admitting: *Deleted

## 2022-05-05 DIAGNOSIS — N201 Calculus of ureter: Secondary | ICD-10-CM

## 2022-05-06 ENCOUNTER — Ambulatory Visit: Payer: Managed Care, Other (non HMO) | Admitting: Urology

## 2022-11-28 ENCOUNTER — Other Ambulatory Visit: Payer: Self-pay | Admitting: Family Medicine

## 2022-11-28 DIAGNOSIS — F339 Major depressive disorder, recurrent, unspecified: Secondary | ICD-10-CM

## 2022-11-29 ENCOUNTER — Other Ambulatory Visit: Payer: Self-pay | Admitting: Family Medicine

## 2022-12-12 ENCOUNTER — Other Ambulatory Visit: Payer: Self-pay | Admitting: Obstetrics and Gynecology

## 2022-12-12 DIAGNOSIS — Z1231 Encounter for screening mammogram for malignant neoplasm of breast: Secondary | ICD-10-CM

## 2023-01-10 ENCOUNTER — Ambulatory Visit
Admission: RE | Admit: 2023-01-10 | Discharge: 2023-01-10 | Disposition: A | Discharge status: Home / Self Care | Payer: Managed Care, Other (non HMO) | Source: Ambulatory Visit | Attending: Obstetrics and Gynecology | Admitting: Obstetrics and Gynecology

## 2023-01-10 DIAGNOSIS — Z1231 Encounter for screening mammogram for malignant neoplasm of breast: Secondary | ICD-10-CM | POA: Diagnosis present

## 2023-01-26 ENCOUNTER — Other Ambulatory Visit: Payer: Self-pay | Admitting: Family Medicine

## 2023-01-26 DIAGNOSIS — F339 Major depressive disorder, recurrent, unspecified: Secondary | ICD-10-CM

## 2023-06-27 ENCOUNTER — Telehealth: Payer: Self-pay | Admitting: Obstetrics and Gynecology

## 2023-06-27 DIAGNOSIS — F339 Major depressive disorder, recurrent, unspecified: Secondary | ICD-10-CM

## 2023-06-27 MED ORDER — VENLAFAXINE HCL ER 150 MG PO CP24
150.0000 mg | ORAL_CAPSULE | Freq: Every day | ORAL | 0 refills | Status: DC
Start: 2023-06-27 — End: 2023-11-14

## 2023-06-27 NOTE — Telephone Encounter (Signed)
[facility] request for the following medications:  venlafaxine XR (EFFEXOR-XR) 150 MG 24 hr capsule   Please advise.  

## 2023-10-31 ENCOUNTER — Ambulatory Visit

## 2023-10-31 VITALS — BP 126/84 | HR 77 | Temp 98.7°F | Ht 67.75 in | Wt 180.5 lb

## 2023-10-31 DIAGNOSIS — F419 Anxiety disorder, unspecified: Secondary | ICD-10-CM

## 2023-10-31 DIAGNOSIS — B002 Herpesviral gingivostomatitis and pharyngotonsillitis: Secondary | ICD-10-CM

## 2023-10-31 DIAGNOSIS — F339 Major depressive disorder, recurrent, unspecified: Secondary | ICD-10-CM | POA: Diagnosis not present

## 2023-10-31 MED ORDER — FLUOXETINE HCL 20 MG PO TABS: 20.0000 mg | ORAL_TABLET | Freq: Every day | ORAL | 0 refills | Status: AC

## 2023-10-31 MED ORDER — ACYCLOVIR 400 MG PO TABS: 400.0000 mg | ORAL_TABLET | Freq: Three times a day (TID) | ORAL | 0 refills | Status: AC

## 2023-10-31 NOTE — Progress Notes (Signed)
 Subjective:    Patient ID: Susan Garner, female    DOB: December 21, 1970, 53 y.o.   MRN: [id number]   Susan Garner is a very pleasant 53 y.o. female who presents today as a new patient.  Past medical, surgical and family history: Reviewed and updated in chart.  Allergies: Reviewed and updated in chart.  Medications: Reviewed and updated in chart.  Social history: Reviewed and updated in chart.  Last PCP and reason for leaving: [facility] practice  Today, wants to discuss Effexor  and anxiety. Wants genetic testing to know what mood meds to be on. Couselling in the past didn't help. Been on Nortriptyline , abilify , amytriptyline, prozac  Biggest symptom is lack of motivation.  Lacks motivation, lacks energy. Denies SI.   Cold sore last week below her lip, been on valcyclovir x2-3d, still having pain  SIGECAPS Anxiety sx     Review of Systems  Psychiatric/Behavioral:  Positive for dysphoric mood and sleep disturbance.   All other systems reviewed and are negative.        Past Medical History:  Diagnosis Date   Allergy    Anxiety    Clostridium difficile colitis 2009   Depression    Headache    History of kidney stones    Ovarian cyst     Social History   Socioeconomic History   Marital status: Single    Spouse name: Not on file   Number of children: Not on file   Years of education: Not on file   Highest education level: Not on file  Occupational History   Not on file  Tobacco Use   Smoking status: Never   Smokeless tobacco: Never  Vaping Use   Vaping status: Never Used  Substance and Sexual Activity   Alcohol use: No    Alcohol/week: 0.0 standard drinks of alcohol   Drug use: No   Sexual activity: Not Currently    Birth control/protection: Surgical  Other Topics Concern   Not on file  Social History Narrative   Lives with Mother and daughter, 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    Social Drivers of Corporate investment banker Strain: Not on file  Food Insecurity: Not on file  Transportation Needs: Not on file  Physical Activity: Not on file  Stress: Not on file  Social Connections: Not on file  Intimate Partner Violence: Not on file    Past Surgical History:  Procedure Laterality Date   COLONOSCOPY WITH PROPOFOL  N/A 04/15/2021   Procedure: COLONOSCOPY WITH PROPOFOL ;  Surgeon: Jinny Carmine, MD;  Location: [facility] ENDOSCOPY;  Service: Endoscopy;  Laterality: N/A;   CYSTOSCOPY/URETEROSCOPY/HOLMIUM LASER/STENT PLACEMENT Right 01/07/2020   Procedure: CYSTOSCOPY/URETEROSCOPY/HOLMIUM LASER/STENT PLACEMENT;  Surgeon: Twylla Glendia BROCKS, MD;  Location: [facility] ORS;  Service: Urology;  Laterality: Right;   FEMUR FRACTURE SURGERY Right 1980's   FRACTURE SURGERY     KIDNEY STONE SURGERY     TONSILLECTOMY     TUBAL LIGATION      Family History  Problem Relation Age of Onset   Anxiety disorder Mother    Congestive Heart Failure Paternal Grandmother    Congestive Heart Failure Paternal Grandfather    Cancer Neg Hx    Diabetes Neg Hx    Heart disease Neg Hx    Breast cancer Neg Hx     No Known Allergies  Current Outpatient Medications on File Prior to Visit  Medication Sig Dispense Refill   valACYclovir  (  VALTREX ) 500 MG tablet Take 500 mg by mouth 2 (two) times daily.     venlafaxine  XR (EFFEXOR -XR) 150 MG 24 hr capsule Take 1 capsule (150 mg total) by mouth daily with breakfast. 90 capsule 0   No current facility-administered medications on file prior to visit.    BP 126/84   Pulse 77   Temp 98.7 F (37.1 C) (Oral)   Ht 5' 7.75 (1.721 m)   Wt 180 lb 8 oz (81.9 kg)   SpO2 96%   BMI 27.65 kg/m   Objective:    Physical Exam Vitals and nursing note reviewed.  Constitutional:      Appearance: Normal appearance.  HENT:     Head: Normocephalic and atraumatic.  Eyes:     Extraocular Movements: Extraocular movements intact.     Conjunctiva/sclera:  Conjunctivae normal.  Skin:    General: Skin is warm.     Findings: Lesion (Left lower region of the peri-oral area) present.  Neurological:     Mental Status: She is alert.  Psychiatric:        Mood and Affect: Mood normal.        Behavior: Behavior normal.            Assessment & Plan:   1. Major depressive disorder, recurrent episode with anxious distress (HCC) (Primary) 2. Anxiety Patient's symptoms remain uncontrolled, Effexor  has been ineffective.  PHQ score is 15 this visit, GAD-7 score is 9, indicating moderately severe depression and mild anxiety respectively.  Per chart review, patient has not been on SSRIs at any time in recent years, will opt for this given that they are first-line, we will opt for Prozac  given patient's lack of motivation and its activating properties.  Counseled about risks and side effects, through process of shared decision making she opts to proceed.  Will discontinue Effexor  with plan to promptly start Prozac  promptly tomorrow morning to avoid withdrawal symptoms.  Counseled patient to monitor for potential withdrawal symptoms, further counseled her on warning signs of rare but dangerous side effect of serotonin syndrome which would prompt ED evaluation.  Will keep close follow-up, reevaluating her again in about 2 weeks to monitor her symptoms in light of this medication switch. Offered patient therapy/counseling, she declines at this time.   - FLUoxetine  (PROZAC ) 20 MG tablet; Take 1 tablet (20 mg total) by mouth daily.  Dispense: 30 tablet; Refill: 0  - Discontinued Effexor    3. Primary herpes simplex infection of oral region Clinical exam reveals lesion in the left lower aspect of the perioral region which is painful, given patient has been self treating with Valtrex  for the last 2 to 3 days without improvement, will discontinue and switch instead to acyclovir .  - acyclovir  (ZOVIRAX ) 400 MG tablet; Take 1 tablet (400 mg total) by mouth 3 (three)  times daily for 5 days.  Dispense: 15 tablet; Refill: 0    Return in about 2 weeks (around 11/14/2023) for Mood.   Twain Stenseth K Lester Crickenberger, MD  10/31/23

## 2023-10-31 NOTE — Patient Instructions (Addendum)
 Thank you for visiting [facility] today! Here's what we talked about: - Stop Effexor  tonight and Start Prozac  promptly the following morning - Start acyclovir  - ED if: palpitations, muscle stiffness, severe agitation, confusion, feeling feverish

## 2023-11-06 ENCOUNTER — Ambulatory Visit: Payer: Self-pay

## 2023-11-06 NOTE — Telephone Encounter (Signed)
 FYI Only or Action Required?: Action required by provider: clinical question for provider, update on patient condition, and please advise if additional medication can be prescribed , feels possible side effects on stopping effexor  Tuesday and feeling jittery and nausea .  Patient was last seen in primary care on 10/31/2023 by Bennett Reuben POUR, MD.  Called Nurse Triage reporting Medication Problem.  Symptoms began several days ago.  Interventions attempted: Nothing.  Symptoms are: gradually worsening.  Triage Disposition: See PCP When Office is Open (Within 3 Days)  Patient/caregiver understands and will follow disposition?: No, wishes to speak with PCP         Reason for Disposition  Prescription request for new medicine (not a refill)  Answer Assessment - Initial Assessment Questions Please advise if additional medication can be prescribe until next OV 11/14/23. Requesting if PCP will order gene antidepressant testing . Patient would like call back or my chart message sent.       1. NAME of MEDICINE: What medicine(s) are you calling about?     Prozac  and effexor   2. QUESTION: What is your question? (e.g., double dose of medicine, side effect)     Can patient get additional medication to help while adjusting to new medication prozac  to help with withdrawing from effexor  that patient stopped on Tuesday?  3. PRESCRIBER: Who prescribed the medicine? Reason: if prescribed by specialist, call should be referred to that group.     PCP 4. SYMPTOMS: Do you have any symptoms? If Yes, ask: What symptoms are you having?  How bad are the symptoms (e.g., mild, moderate, severe)     Hard to motivate to get up and get ready, brain fog , trouble concentrating not as severe but not gone. Nausea, jittery, snappy irritable .  5. PREGNANCY:  Is there any chance that you are pregnant? When was your last menstrual period?     na  Protocols used: Medication Question Call-A-AH

## 2023-11-06 NOTE — Telephone Encounter (Signed)
 Pls call pt and instruct as follows: If withdrawal symptoms are unbearable she can stop the prozac , she won't withdraw from it since she has not been on it very long. We can restart her Effexor  to stop the withdrawal symptoms and taper it more slowly over 2 wks as below: She can restart at half the original dose (half a tablet) every day for 1wk. Then after that she can do half a tablet every other day for a week, then discontinue the effexor  completely after that.   I will see her in the office during that second week and we can re-introduce the prozac  slowly at that time.

## 2023-11-06 NOTE — Telephone Encounter (Signed)
 Copied from CRM #[id number]. Topic: Clinical - Medication Question >> Nov 06, 2023  9:13 AM Larissa RAMAN wrote: Reason for CRM: Patient has questions regarding FLUoxetine  (PROZAC ) 20 MG tablet. She states medication is not helping and she still has brain fog. Patient is requesting a callback.

## 2023-11-07 ENCOUNTER — Other Ambulatory Visit: Payer: Self-pay

## 2023-11-07 ENCOUNTER — Telehealth: Payer: Self-pay

## 2023-11-07 NOTE — Telephone Encounter (Signed)
 Pt sent a MyChart message asking about this. I copied and pasted the information below and sent it to her in the MyChart message.

## 2023-11-07 NOTE — Telephone Encounter (Signed)
 So that we do not end up playing phone tag, here is Dr Charlyn message. Let us  know if you have any questions or concerns.   If withdrawal symptoms are unbearable she can stop the prozac , she won't withdraw from it since she has not been on it very long. We can restart her Effexor  to stop the withdrawal symptoms and taper it more slowly over 2 wks as below: She can restart at half the original dose (half a tablet) every day for 1wk. Then after that she can do half a tablet every other day for a week, then discontinue the effexor  completely after that.   I will see her in the office during that second week and we can re-introduce the prozac  slowly at that time.    We will need to set you up for an office visit with her after your first full week of doing this.

## 2023-11-07 NOTE — Telephone Encounter (Signed)
 Left message to call office.

## 2023-11-07 NOTE — Progress Notes (Signed)
 error

## 2023-11-07 NOTE — Telephone Encounter (Signed)
 Patient has been notified and information sent to her email about the test to be done at [facility].

## 2023-11-14 ENCOUNTER — Ambulatory Visit

## 2023-11-14 VITALS — BP 118/84 | HR 64 | Temp 98.0°F | Ht 67.75 in | Wt 181.0 lb

## 2023-11-14 DIAGNOSIS — F321 Major depressive disorder, single episode, moderate: Secondary | ICD-10-CM

## 2023-11-14 DIAGNOSIS — F19939 Other psychoactive substance use, unspecified with withdrawal, unspecified: Secondary | ICD-10-CM | POA: Diagnosis not present

## 2023-11-14 DIAGNOSIS — R519 Headache, unspecified: Secondary | ICD-10-CM

## 2023-11-14 MED ORDER — IBUPROFEN 600 MG PO TABS: 600.0000 mg | ORAL_TABLET | Freq: Three times a day (TID) | ORAL | 0 refills | Status: AC | PRN

## 2023-11-14 NOTE — Patient Instructions (Addendum)
 Thank you for visiting [facility] today! Here's what we talked about: - Take effexor  1 capsule every day for 1 week, then 1 vcapsule every other day for 1 week.   - Take ibuprofen  as needed for headache

## 2023-11-14 NOTE — Progress Notes (Signed)
 Subjective:    Patient ID: Susan Garner, female    DOB: [birth year], 53 y.o.   MRN: [id number]   Susan Garner is a very pleasant 53 y.o. female who presents today would like genetic test. Was having diarrhea, felt sick, still feels unmotivated, having headaches, and earache,  Does not want to be on any medications at this time, wants to wait for test.  Saw psychiatrists in the past, felt it was waste of time Headaches, intermittent, daily on R side, started when stopped the effexor , feels like pressure, lasts several hours, assoc with photosentivity, 7/10, ibuprofen , aspirin, does not think aura is present.  Says she also feels brain fog.  Review of Systems  All other systems reviewed and are negative.       No Known Allergies  Current Outpatient Medications on File Prior to Visit  Medication Sig Dispense Refill   valACYclovir  (VALTREX ) 500 MG tablet Take 500 mg by mouth 2 (two) times daily. (Patient taking differently: Take 500 mg by mouth 2 (two) times daily as needed.)     FLUoxetine  (PROZAC ) 20 MG tablet Take 1 tablet (20 mg total) by mouth daily. 30 tablet 0   No current facility-administered medications on file prior to visit.    BP 118/84 (BP Location: Left Arm, Patient Position: Sitting, Cuff Size: Normal)   Pulse 64   Temp 98 F (36.7 C) (Oral)   Ht 5' 7.75 (1.721 m)   Wt 181 lb (82.1 kg)   SpO2 99%   BMI 27.72 kg/m   Objective:    Physical Exam Vitals and nursing note reviewed.  Constitutional:      Appearance: Normal appearance.  HENT:     Head: Normocephalic and atraumatic.     Right Ear: Hearing, tympanic membrane, ear canal and external ear normal.     Left Ear: Hearing, tympanic membrane, ear canal and external ear normal.  Eyes:     Extraocular Movements: Extraocular movements intact.     Conjunctiva/sclera: Conjunctivae normal.  Skin:    General: Skin is warm.  Neurological:     Mental Status: She is alert.  Psychiatric:         Mood and Affect: Mood normal.        Behavior: Behavior normal.            Assessment & Plan:   1. 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 Depression, major, single episode, moderate (HCC) 3. Nonintractable episodic headache, unspecified headache type Last visit, attempts were made to switch from Effexor  to Prozac , however, patient did not tolerate switch and experienced significant withdrawal symptoms.  Patient was instructed to hold Prozac  while tapering Effexor  more slowly, restarting at half original dose daily for 1 week, followed by half a tablet every other day for a week, then discontinue completely. Patient opted to instead to remain off of the Effexor  and has continued experiencing withdrawal syndrome, current symptoms including her headache and brain fog.  Physical exam is benign, given that patient did not have a history of headaches prior to Effexor  discontinuation, her lingering headaches are likely part of SNRI withdrawal syndrome from the Effexor .  Counseled patient accordingly, she is agreeable to restart the Effexor  in order to treat the current withdrawal symptoms and taper off more slowly in preparation to eventually transition to a different medication pending results of pharmacodynamics panel as ordered below.  Extensively counseled patient that SSRIs have the best evidence for efficacy and are generally well-tolerated, and  I would not recommend starting on non-SSRI medication even if the test supported this.  Counseled that my hope is for the test to elucidate specifically which SSRI she is most likely to respond to and we could proceed on that basis.  Should the test recommend atypical antidepressant, I counseled the patient that I would still recommend SSRI given better efficacy, in which case, we could revisit Prozac  and gradually uptitrate the dose as patient tapers off the Effexor . Patient provided with taper schedule for Effexor , given that  capsules cannot be cut in half, she will resume at 1 capsule daily for 1 week followed by 1 capsule every other day for 1 week, then completely discontinue.  Will continue to hold off Prozac  at this time pending test results. Patient declined therapy last visit, will rediscuss this at next visit. In the meantime, we will treat headache symptoms with as needed ibuprofen  as below.  - ibuprofen  (ADVIL ) 600 MG tablet; Take 1 tablet (600 mg total) by mouth every 8 (eight) hours as needed.  Dispense: 30 tablet; Refill: 0  - Other/Misc lab test   Return in about 1 week (around 11/21/2023) for Mood.   Susan Hostler K Schyler Counsell, MD  11/14/23

## 2023-11-23 ENCOUNTER — Other Ambulatory Visit: Payer: Self-pay

## 2023-11-23 DIAGNOSIS — F339 Major depressive disorder, recurrent, unspecified: Secondary | ICD-10-CM

## 2023-11-23 DIAGNOSIS — F419 Anxiety disorder, unspecified: Secondary | ICD-10-CM

## 2023-12-07 LAB — PHARMACOGENOMICS PANEL WITH CORIELL LIFE SCIENCES (CLS) REPORT
F5 GENOTYPE/DIPLOTYPE: POSITIVE
HLA A*31:01 GENOTYPE/ DIPLOTYPE: NEGATIVE
HLA A*31:01 PHENOTYPE: NEGATIVE
HLA B*15:02 GENOTYPE/ DIPLOTYPE: NEGATIVE
HLA B*15:02 PHENOTYPE: NEGATIVE
HLA B*57:01 GENOTYPE/ DIPLOTYPE: NEGATIVE
HLA B*57:01 PHENOTYPE: NEGATIVE
HLA B*58:01 GENOTYPE/ DIPLOTYPE: NEGATIVE
HLA B*58:01 PHENOTYPE: NEGATIVE

## 2023-12-11 NOTE — Telephone Encounter (Signed)
 Results placed in Dr Charlyn inbox in her office.

## 2023-12-14 NOTE — Telephone Encounter (Signed)
 lvm for pt to call office to schedule appt.

## 2023-12-15 ENCOUNTER — Ambulatory Visit

## 2023-12-15 VITALS — BP 128/86 | HR 58 | Temp 98.6°F | Ht 67.75 in | Wt 182.0 lb

## 2023-12-15 DIAGNOSIS — F321 Major depressive disorder, single episode, moderate: Secondary | ICD-10-CM

## 2023-12-15 DIAGNOSIS — D6851 Activated protein C resistance: Secondary | ICD-10-CM | POA: Diagnosis not present

## 2023-12-15 NOTE — Progress Notes (Signed)
 Subjective:   This visit was conducted in person. The patient gave informed consent to the use of Abridge AI technology to record the contents of the encounter as documented below.   Patient ID: Susan Garner, female    DOB: [birth year], 53 y.o.   MRN: [id number]   Discussed the use of AI scribe software for clinical note transcription with the patient, who gave verbal consent to proceed.  History of Present Illness Susan Garner is a 53 year old female who presents with concerns about genetic test results related to her depression treatment.  She is concerned about the results of a genetic panel conducted to assess her response to mood medications. The panel included genes related to medication metabolism.   She has previously briefly used Prozac , which caused stomach discomfort and diarrhea. She is experiencing menopause, which she feels exacerbates her symptoms. She mentions ongoing brain fog and headaches. She has been on Effexor  for over twenty years and questions why it was initially prescribed.  She is not currently taking any medication, but plans to start Prozac  as discussed during the visit.  She expresses frustration with the lack of specific guidance from the genetic test and the financial burden of her copayments.  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 She has no family history of blood clots and is uncertain about the implications of this finding.      Review of Systems  All other systems reviewed and are negative.       No Known Allergies  Current Outpatient Medications on File Prior to Visit  Medication Sig Dispense Refill   FLUoxetine  (PROZAC ) 20 MG tablet Take 1 tablet (20 mg total) by mouth daily. (Patient not taking: Reported on 12/15/2023) 30 tablet 0   ibuprofen  (ADVIL ) 600 MG tablet Take 1 tablet (600 mg total) by mouth every 8 (eight) hours as needed. 30 tablet 0   valACYclovir  (VALTREX ) 500 MG tablet Take  500 mg by mouth 2 (two) times daily. (Patient taking differently: Take 500 mg by mouth 2 (two) times daily as needed.)     No current facility-administered medications on file prior to visit.    BP 128/86 (BP Location: Left Arm, Patient Position: Sitting, Cuff Size: Normal)   Pulse (!) 58   Temp 98.6 F (37 C) (Oral)   Ht 5' 7.75 (1.721 m)   Wt 182 lb (82.6 kg)   LMP 10/29/2019   SpO2 98%   BMI 27.88 kg/m   Objective:      Physical Exam GENERAL: Alert, cooperative, well developed, no acute distress. HEAD: Normocephalic atraumatic. EYES: Extraocular movements intact BL, conjunctivae normal BL. NEUROLOGICAL: Oriented to person, place and time, no gait abnormalities, moves all extremities without gross motor or sensory deficit. PSYCHIATRIC: Normal mood and affect         Assessment & Plan:    Assessment & Plan Major depressive disorder, single episode, moderate Extensively discussed results of pharmacodynamics panel ordered through [facility] per patient's request.  Of note, patient had been previously cautioned about limited evidence behind the clinical utility of this test.  Patient was further counseled by our lab liaison about the ambiguity surrounding the labs included in the actual panel ([facility] reportedly could not provide a specific description of what the panel would actually test for), patient still opted to proceed with testing. Results discussed with patient, explained that although the panel provided genetic information regarding drug metabolism, information provided is not specific  to any one drug or SSRI.  Further counseled patient that given SSRIs are first-line for her condition, I recommend retrial of the Prozac , given that patient self discontinued after only a few days on low-dose.  She is agreeable to restart as below.  Further offered patient psychiatric referral for more detailed interpretation of test results and further recommendations about its implications of  treatment, she declines all the available [place] locations at this time, stating that she is not willing to drive that far.  She opts to find a private psychiatrist.  Counseled patient that if she changes her mind at a later time, referral can be sent.  - Restart Fluoxetine  20 mg orally daily. Increase to 40 mg daily if no improvement after 7 days. - Schedule follow-up in 2 weeks to assess medication response and adjust dosage.  Factor V Leiden mutation Genetic testing confirmed Factor V mutation, increasing clot risk. - Refer to hematologist for evaluation and potential anticoagulation therapy.    Return in about 2 weeks (around 12/29/2023) for Mood.   Carden Teel K Darrielle Pflieger, MD  12/15/23     Contains text generated by Abridge.

## 2023-12-15 NOTE — Patient Instructions (Addendum)
 Thank you for visiting [facility] today! Here's what we talked about: - Start Prozac  20mg  daily, if no improvement after 7 days, take 2 tablets together daily. - Make psychiatrist appointment

## 2023-12-18 ENCOUNTER — Ambulatory Visit: Payer: Self-pay

## 2023-12-23 ENCOUNTER — Telehealth: Payer: Self-pay

## 2023-12-26 ENCOUNTER — Telehealth: Payer: Self-pay

## 2023-12-26 NOTE — Telephone Encounter (Signed)
 We attempted to call patient and discuss pharmacogenomics panel results in greater detail, was unsuccessful. Routing to clinical pool to please call patient and advise as follows: We received extra information from your pharmaco-genomics panel which we had not received before.  This extra information has given further details about the medications that are most appropriate for you.  Based on the findings Prozac  also known as fluoxetine  is a good fit for you and you will metabolize it well.  Other medications such as trazodone  and mirtazapine would also work well for you. However, Effexor  was listed as a medication to avoid because of the way you metabolize it, you do not get adequate levels in your system to get any kind of effect, this is most likely why you did not respond to it. Based on these findings, I recommend you continue on the Prozac , and we can increase the dose based on your response if needed.

## 2023-12-26 NOTE — Telephone Encounter (Signed)
 Attempted to call patient to discuss her pharmacodynamic panel results in greater detail, with more context from complete report which our clinic recently received, left VM to return call.  Will reattempt to contact at a later time.

## 2023-12-27 ENCOUNTER — Telehealth: Payer: Self-pay

## 2023-12-27 NOTE — Telephone Encounter (Signed)
 This RN attempted to contact patient. No answer. LVM. Will route to office for follow up.   Copied from CRM [id number]. Topic: Clinical - Lab/Test Results >> Dec 27, 2023  5:01 PM Nessti S wrote: Reason for CRM:  pt returning call to nurse about lab results. call back number [phone]

## 2023-12-27 NOTE — Telephone Encounter (Signed)
 Left message to call office.

## 2023-12-28 NOTE — Telephone Encounter (Unsigned)
 Copied from CRM [id number]. Topic: General - Other >> Dec 28, 2023  8:39 AM Cleave MATSU wrote: Reason for CRM: pt returned call back to Coltin Casher I discussed results along with appropriate medications she should be taking . Pt said she will callback to schedule appt to come in and talk with Dr.

## 2023-12-28 NOTE — Telephone Encounter (Signed)
 Left message on VM and advised her that I was going to send her a MyChart message with the information so that we do not keep missing each other.

## 2023-12-28 NOTE — Telephone Encounter (Signed)
 Dr Bennett, has this been sent in to be scanned?

## 2023-12-29 NOTE — Telephone Encounter (Signed)
 Pls go to link below: [email or web address]   Enter patient details as requested to generate complete report, this can be scanned to her MyChart so she can see.

## 2024-01-05 ENCOUNTER — Inpatient Hospital Stay

## 2024-01-05 ENCOUNTER — Inpatient Hospital Stay: Attending: Internal Medicine | Admitting: Internal Medicine

## 2024-01-05 ENCOUNTER — Encounter: Payer: Self-pay | Admitting: Internal Medicine

## 2024-01-05 VITALS — BP 93/62 | HR 70 | Temp 97.3°F | Resp 18 | Ht 68.0 in

## 2024-01-05 DIAGNOSIS — D6851 Activated protein C resistance: Secondary | ICD-10-CM | POA: Insufficient documentation

## 2024-01-05 NOTE — Assessment & Plan Note (Addendum)
  Factor V Leiden mutation (heterozygous) Heterozygous Factor V Leiden mutation identified. No thrombosis history or symptoms. Slightly increased thrombosis risk. Discussed risk factors and importance of informing healthcare providers before surgeries. No routine anticoagulation or further testing needed unless thrombosis occurs. - Inform healthcare providers of Factor V Leiden mutation before surgeries. - Monitor for thrombosis symptoms, such as leg swelling or pain. - Consider family screening for Factor V Leiden mutation, especially for siblings and daughter.      Thank you Dr.Bowa MD for allowing me to participate in the care of your pleasant patient. Please do not hesitate to contact me with questions or concerns in the interim.   DISPOSITION: # NO labs today-  # follow up as needed- Dr.B

## 2024-01-05 NOTE — Progress Notes (Signed)
 [facility] CONSULT NOTE  Patient Care Team: Bennett Reuben POUR, MD as PCP - General (Family Medicine) Rennie Cindy SAUNDERS, MD as Consulting Physician (Oncology)  CHIEF COMPLAINTS/PURPOSE OF CONSULTATION: personal history of factor V Leiden HEMATOLOGY HISTORY:     HISTORY OF PRESENTING ILLNESS:  Susan Garner 53 y.o.  female pleasant patient was been referred to us  for further evaluation of personal history of hypercoagulable state-factor V Leiden heterozygosity without any personal history of thrombosis  Discussed the use of AI scribe software for clinical note transcription with the patient, who gave verbal consent to proceed.  History of Present Illness   Susan Garner is a 53 year old female who presents for evaluation of genetic testing results and medication management.  She has been on Effexor  for over twenty years for depression but felt it was no longer effective. She underwent pharmacogenomic testing, which indicated that Effexor  was not suitable for her.  The genetic test revealed a positive result for factor V Leiden mutation. She has never experienced blood clots, and there is no family history of blood clots. Her daughter, aged 63, also has no history of blood clots. She has been through menopause and was previously on estrogen therapy, which she discontinued after experiencing heavy bleeding.  She has a sedentary job in technical brewer.  She does not smoke, and her mother, who smokes, does so outside the house. She has not been on birth control since 2006 after having her tubes tied post-pregnancy.  No history of blood clots, no leg swelling, some leg pain and hip issues.       Review of Systems  Constitutional:  Negative for chills, diaphoresis, fever, malaise/fatigue and weight loss.  HENT:  Negative for nosebleeds and sore throat.   Eyes:  Negative for double vision.  Respiratory:  Negative for cough, hemoptysis, sputum production,  shortness of breath and wheezing.   Cardiovascular:  Negative for chest pain, palpitations, orthopnea and leg swelling.  Gastrointestinal:  Negative for abdominal pain, blood in stool, constipation, diarrhea, heartburn, melena, nausea and vomiting.  Genitourinary:  Negative for dysuria, frequency and urgency.  Musculoskeletal:  Negative for back pain and joint pain.  Skin: Negative.  Negative for itching and rash.  Neurological:  Negative for dizziness, tingling, focal weakness, weakness and headaches.  Endo/Heme/Allergies:  Does not bruise/bleed easily.  Psychiatric/Behavioral:  Negative for depression. The patient is not nervous/anxious and does not have insomnia.      MEDICAL HISTORY:  Past Medical History:  Diagnosis Date   Allergy    Anxiety    Clostridium difficile colitis 2009   Depression    Headache    History of kidney stones    Ovarian cyst     SURGICAL HISTORY: Past Surgical History:  Procedure Laterality Date   COLONOSCOPY WITH PROPOFOL  N/A 04/15/2021   Procedure: COLONOSCOPY WITH PROPOFOL ;  Surgeon: Jinny Carmine, MD;  Location: [facility] END[facility];  Service: Endoscopy;  Laterality: N/A;   CYSTOSCOPY/URETEROSCOPY/HOLMIUM LASER/STENT PLACEMENT Right 01/07/2020   Procedure: CYSTOSCOPY/URETEROSCOPY/HOLMIUM LASER/STENT PLACEMENT;  Surgeon: Twylla Glendia BROCKS, MD;  Location: [facility] ORS;  Service: Urology;  Laterality: Right;   FEMUR FRACTURE SURGERY Right 1980's   FRACTURE SURGERY     KIDNEY STONE SURGERY     TONSILLECTOMY     TUBAL LIGATION      SOCIAL HISTORY: Social History   Socioeconomic History   Marital status: Single    Spouse name: Not on file   Number of children: Not on file  Years of education: Not on file   Highest education level: Not on file  Occupational History   Not on file  Tobacco Use   Smoking status: Never   Smokeless tobacco: Never  Vaping Use   Vaping status: Never Used  Substance and Sexual Activity   Alcohol use: No    Alcohol/week: 0.0  standard drinks of alcohol   Drug use: No   Sexual activity: Not Currently    Birth control/protection: Surgical  Other Topics Concern   Not on file  Social History Narrative   Lives with Mother and daughter, works 2 jobs in technical brewer and [employer], occasionally etoh, not currently sexually active.    Social Drivers of Corporate Investment Banker Strain: Not on file  Food Insecurity: Not on file  Transportation Needs: Not on file  Physical Activity: Not on file  Stress: Not on file  Social Connections: Not on file  Intimate Partner Violence: Not on file    FAMILY HISTORY: Family History  Problem Relation Age of Onset   Anxiety disorder Mother    Congestive Heart Failure Paternal Grandmother    Congestive Heart Failure Paternal Grandfather    Cancer Neg Hx    Diabetes Neg Hx    Heart disease Neg Hx    Breast cancer Neg Hx     ALLERGIES:  has no known allergies.  MEDICATIONS:  Current Outpatient Medications  Medication Sig Dispense Refill   FLUoxetine  (PROZAC ) 20 MG tablet Take 1 tablet (20 mg total) by mouth daily. (Patient not taking: Reported on 01/05/2024) 30 tablet 0   ibuprofen  (ADVIL ) 600 MG tablet Take 1 tablet (600 mg total) by mouth every 8 (eight) hours as needed. (Patient not taking: Reported on 01/05/2024) 30 tablet 0   valACYclovir  (VALTREX ) 500 MG tablet Take 500 mg by mouth 2 (two) times daily. (Patient not taking: Reported on 01/05/2024)     No current facility-administered medications for this visit.     PHYSICAL EXAMINATION:   Vitals:   01/05/24 1416  BP: 93/62  Pulse: 70  Resp: 18  Temp: (!) 97.3 F (36.3 C)  SpO2: 100%   There were no vitals filed for this visit.  Physical Exam Vitals and nursing note reviewed.  HENT:     Head: Normocephalic and atraumatic.     Mouth/Throat:     Pharynx: Oropharynx is clear.  Eyes:     Extraocular Movements: Extraocular movements intact.     Pupils: Pupils are equal, round, and reactive to light.   Cardiovascular:     Rate and Rhythm: Normal rate and regular rhythm.  Pulmonary:     Comments: Decreased breath sounds bilaterally.  Abdominal:     Palpations: Abdomen is soft.  Musculoskeletal:        General: Normal range of motion.     Cervical back: Normal range of motion.  Skin:    General: Skin is warm.  Neurological:     General: No focal deficit present.     Mental Status: She is alert and oriented to person, place, and time.  Psychiatric:        Behavior: Behavior normal.        Judgment: Judgment normal.      LABORATORY DATA:  I have reviewed the data as listed Lab Results  Component Value Date   WBC 5.9 01/04/2020   HGB 12.7 01/04/2020   HCT 39.2 01/04/2020   MCV 93.8 01/04/2020   PLT 310 01/04/2020   No results  for input(s): NA, K, CL, CO2, GLUCOSE, BUN, CREATININE, CALCIUM, GFRNONAA, GFRAA, PROT, ALBUMIN, AST, ALT, ALKPHOS, BILITOT, BILIDIR, IBILI in the last 8760 hours.   No results found.  ASSESSMENT & PLAN:   Factor V Leiden mutation    Factor V Leiden mutation (heterozygous) Heterozygous Factor V Leiden mutation identified. No thrombosis history or symptoms. Slightly increased thrombosis risk. Discussed risk factors and importance of informing healthcare providers before surgeries. No routine anticoagulation or further testing needed unless thrombosis occurs. - Inform healthcare providers of Factor V Leiden mutation before surgeries. - Monitor for thrombosis symptoms, such as leg swelling or pain. - Consider family screening for Factor V Leiden mutation, especially for siblings and daughter.      Thank you Dr.Bowa MD for allowing me to participate in the care of your pleasant patient. Please do not hesitate to contact me with questions or concerns in the interim.   DISPOSITION: # NO labs today-  # follow up as needed- Dr.B       Cindy JONELLE Joe, MD 01/07/2024 5:58 PM

## 2024-01-05 NOTE — Progress Notes (Signed)
 Patient feels like she might be going through menopause. She is having the same symptoms as those going through menopause.
